# Patient Record
Sex: Male | Born: 1965 | Race: White | Hispanic: No | Marital: Single | Smoking: Former smoker
Health system: Southern US, Community
[De-identification: ages and names within clinical notes are randomized; demographics above are authoritative.]

## PROBLEM LIST (undated history)

## (undated) DIAGNOSIS — I10 Essential (primary) hypertension: Secondary | ICD-10-CM

---

## 2001-03-02 ENCOUNTER — Emergency Department (HOSPITAL_COMMUNITY): Admission: EM | Admit: 2001-03-02 | Discharge: 2001-03-02 | Payer: Self-pay | Admitting: Emergency Medicine

## 2001-03-02 ENCOUNTER — Encounter: Payer: Self-pay | Admitting: Emergency Medicine

## 2001-05-31 ENCOUNTER — Emergency Department (HOSPITAL_COMMUNITY): Admission: EM | Admit: 2001-05-31 | Discharge: 2001-05-31 | Payer: Self-pay | Admitting: Emergency Medicine

## 2001-05-31 ENCOUNTER — Encounter: Payer: Self-pay | Admitting: Emergency Medicine

## 2002-10-10 ENCOUNTER — Encounter: Payer: Self-pay | Admitting: Emergency Medicine

## 2002-10-10 ENCOUNTER — Emergency Department (HOSPITAL_COMMUNITY): Admission: EM | Admit: 2002-10-10 | Discharge: 2002-10-10 | Payer: Self-pay | Admitting: Emergency Medicine

## 2004-07-26 ENCOUNTER — Emergency Department (HOSPITAL_COMMUNITY): Admission: EM | Admit: 2004-07-26 | Discharge: 2004-07-27 | Payer: Self-pay | Admitting: Emergency Medicine

## 2004-07-27 ENCOUNTER — Ambulatory Visit (HOSPITAL_COMMUNITY): Admission: RE | Admit: 2004-07-27 | Discharge: 2004-07-27 | Payer: Self-pay | Admitting: Emergency Medicine

## 2008-12-26 ENCOUNTER — Ambulatory Visit: Payer: Self-pay | Admitting: Unknown Physician Specialty

## 2008-12-26 ENCOUNTER — Emergency Department: Payer: Self-pay | Admitting: Emergency Medicine

## 2009-09-25 IMAGING — CR DG LUMBAR SPINE 2-3V
1 series · 3 of 3 positions shown · non-contrast
Comparison: none

REASON FOR EXAM: right low back pain
COMMENTS:

PROCEDURE:     DXR - DXR LUMBAR SPINE AP AND LATERAL  - December 26, 2008 [DATE]
RESULT:     The spinal alignment, vertebral body heights and intervertebral
disc spaces appear to be maintained. There is no fracture or subluxation.
The bowel gas pattern is unremarkable.

[Series 1: view not recorded · 0.17mm/px · 3 of 3 slices shown]
[im 1/3]
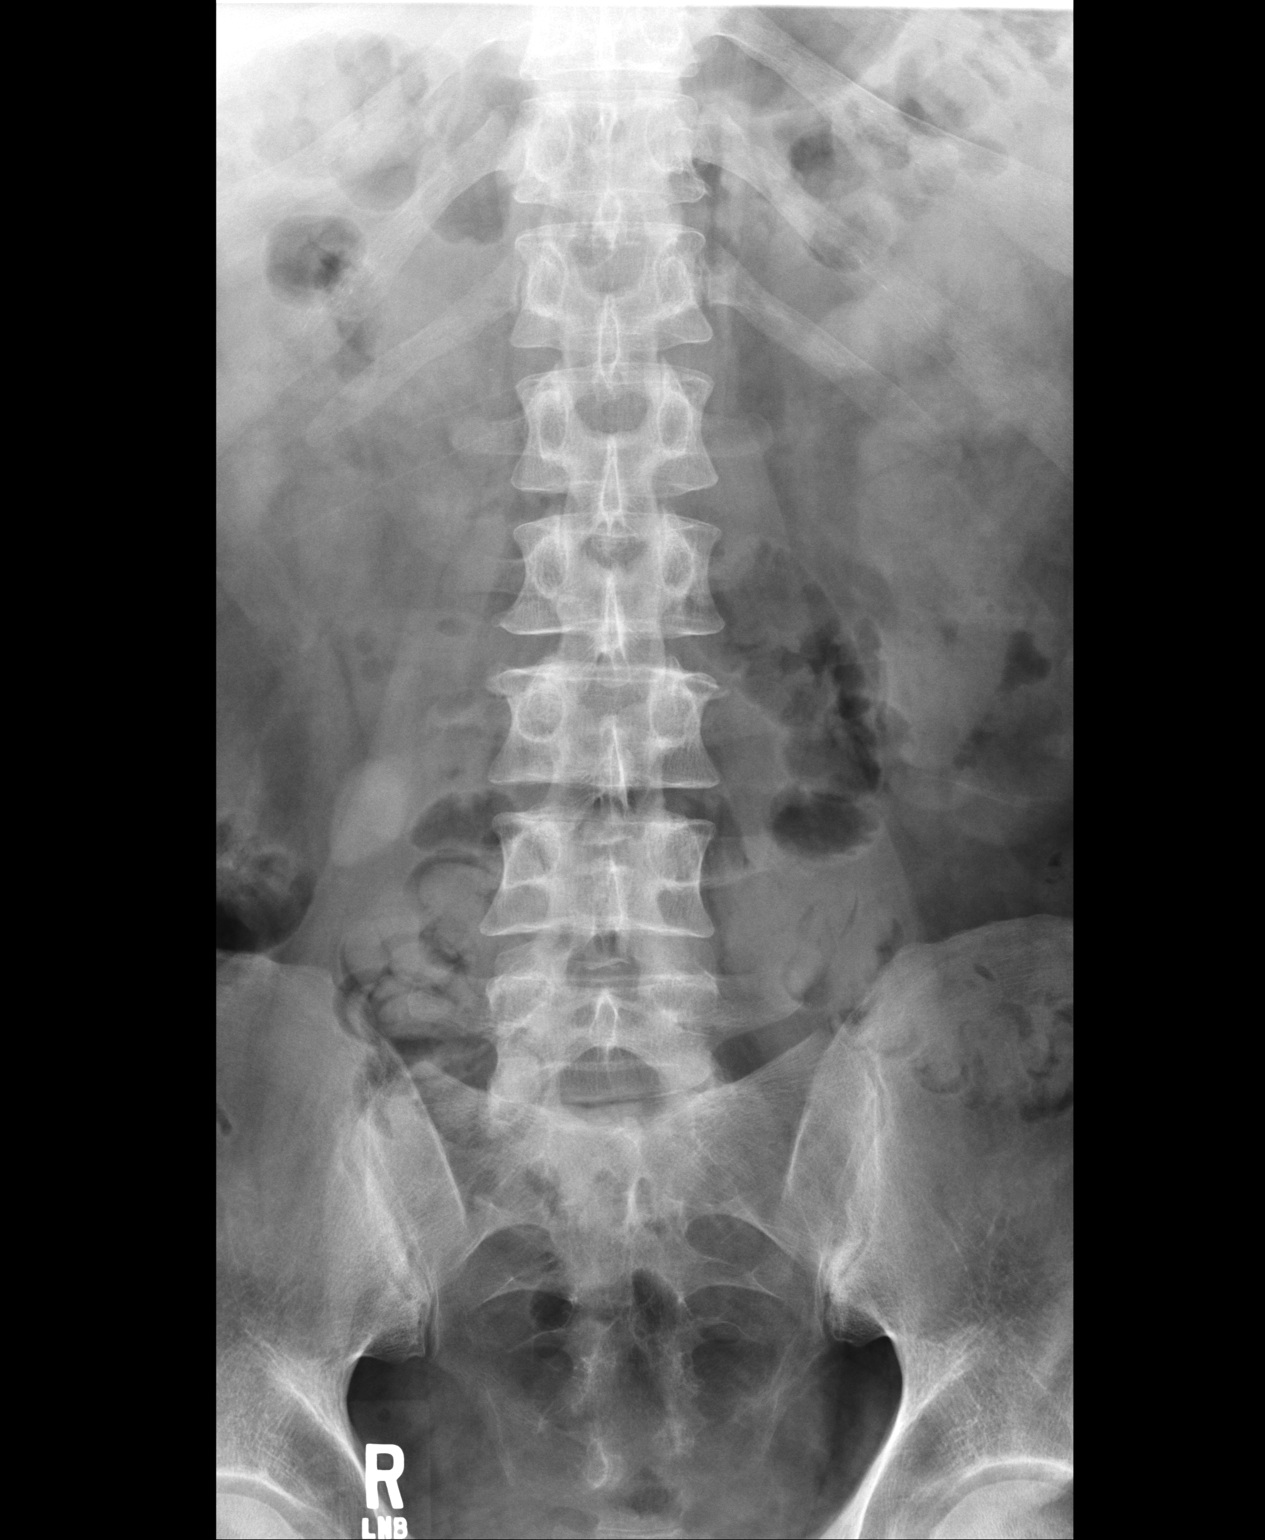
[im 2/3]
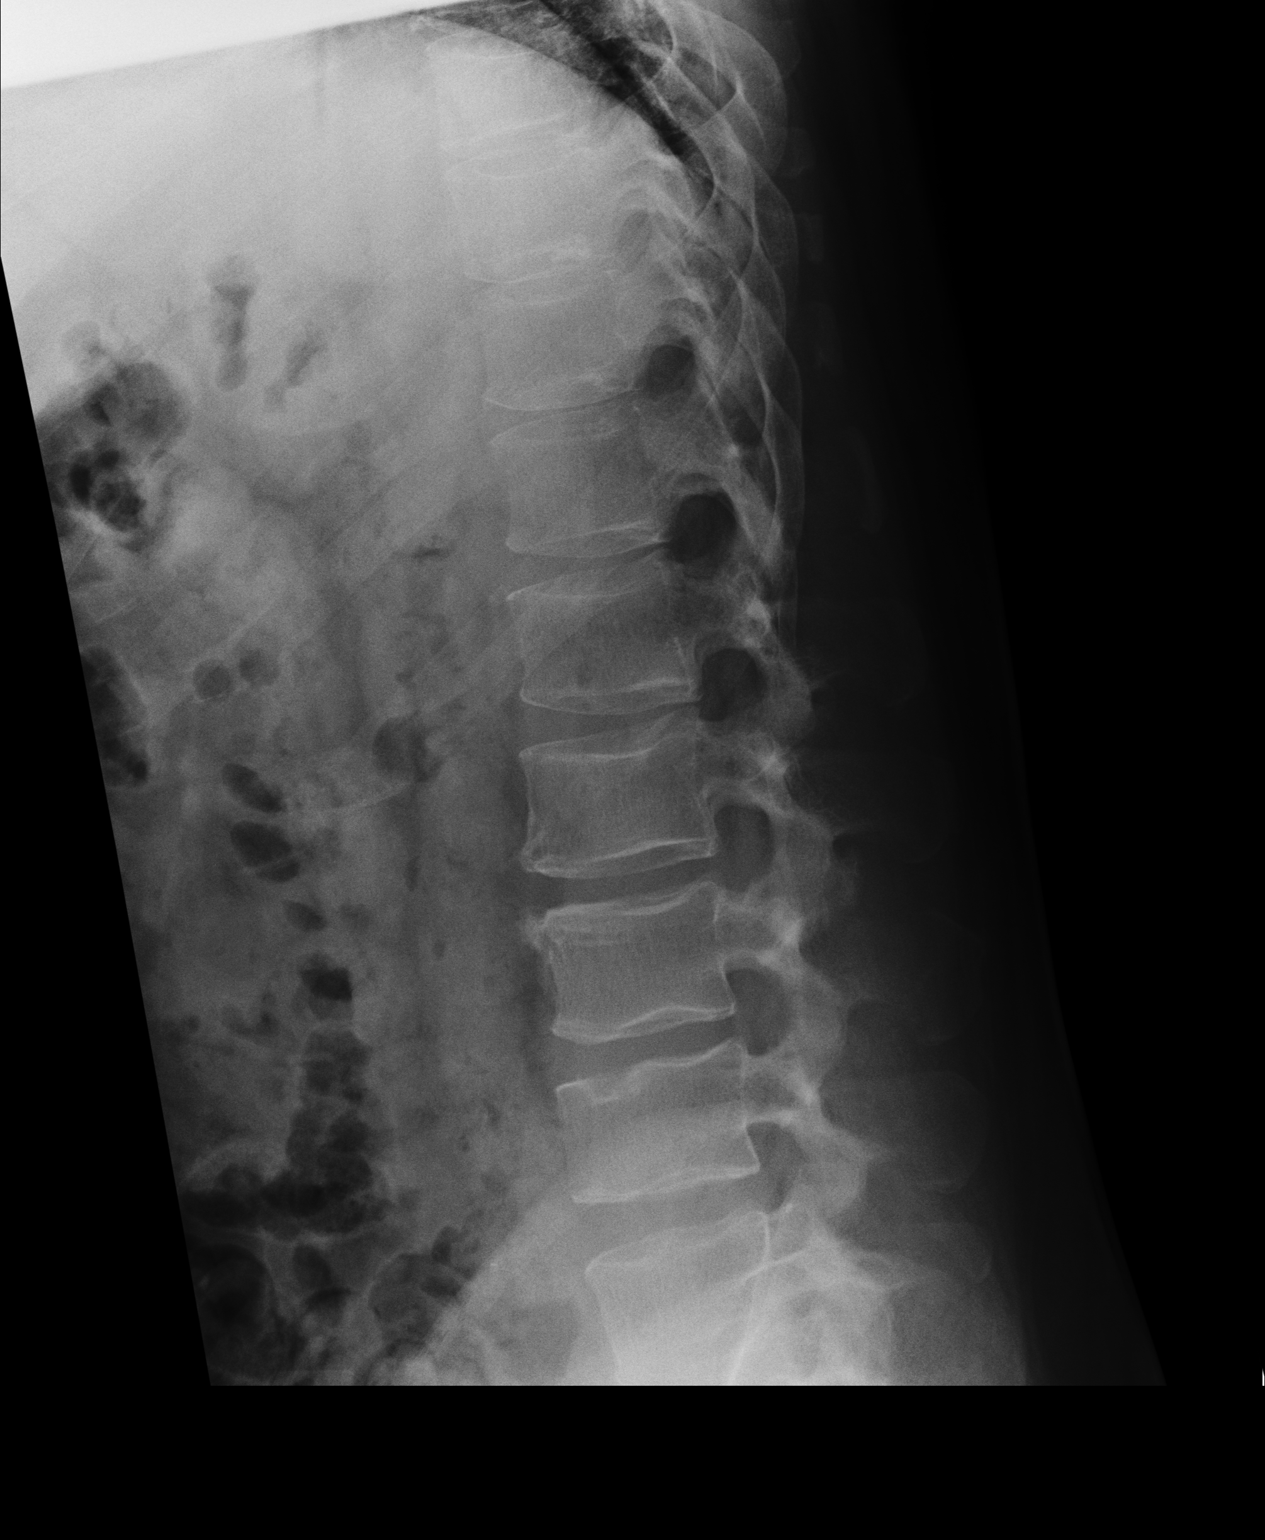
[im 3/3]
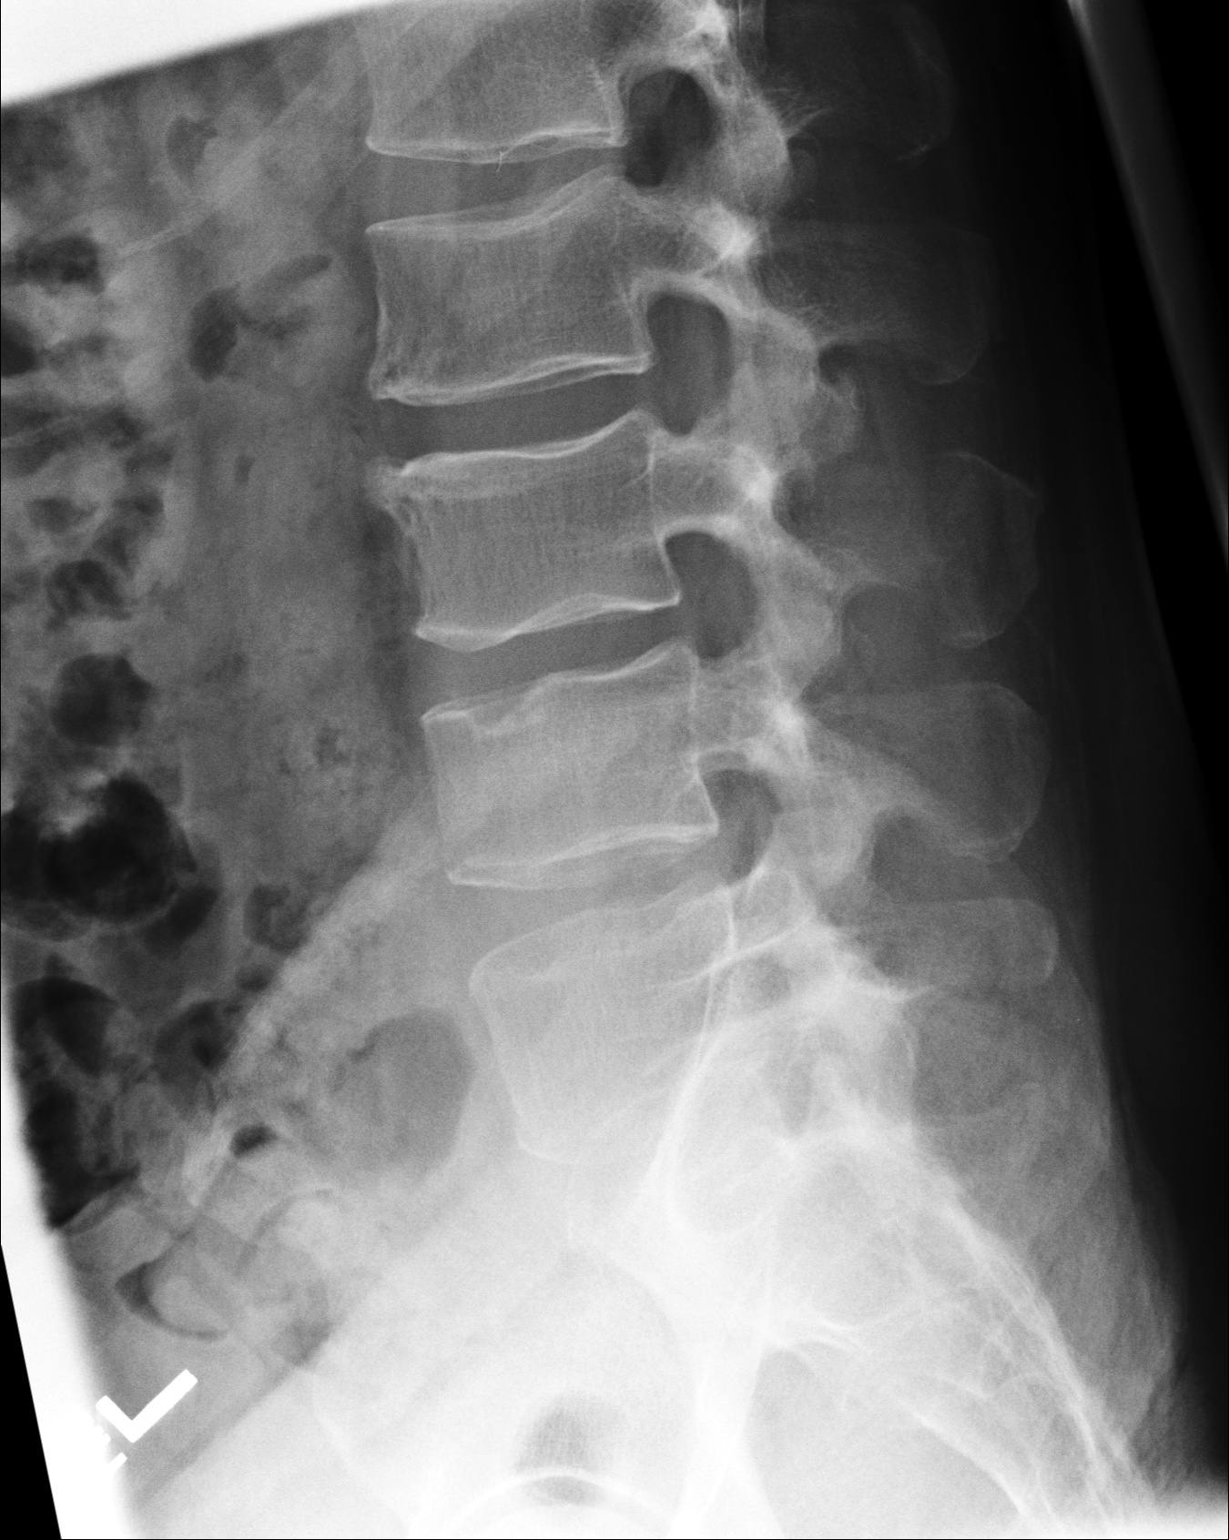

[3 of 3 positions shown; findings below may reference images not displayed]

IMPRESSION: No acute bony abnormality evident.

## 2009-09-25 IMAGING — CR RIGHT HIP - COMPLETE 2+ VIEW
1 series · 2 of 2 positions shown · non-contrast
Comparison: none

REASON FOR EXAM: right hip/back pain
COMMENTS:

PROCEDURE:     DXR - DXR HIP RIGHT COMPLETE  - December 26, 2008 [DATE]
RESULT:     Images of the right hip demonstrate no evidence of fracture,
dislocation or radiopaque foreign body.

[Series 1: view not recorded · 0.17mm/px · 2 of 2 slices shown]
[im 1/2]
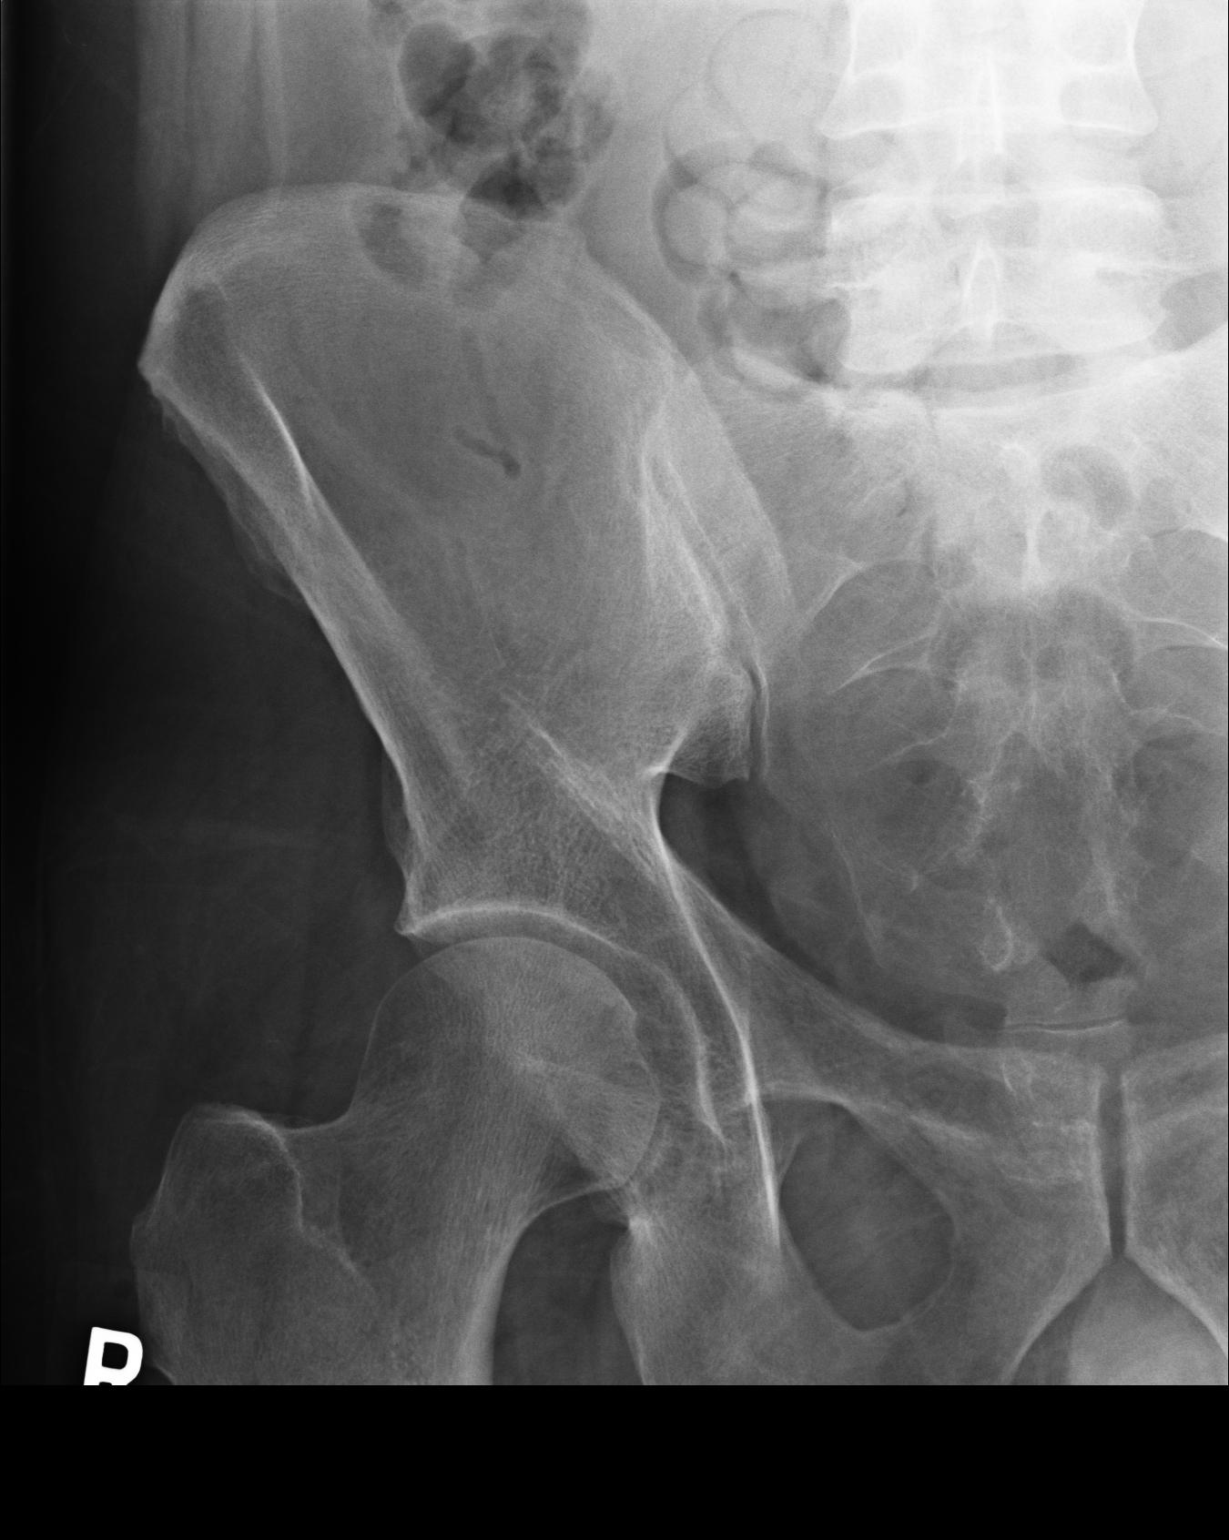
[im 2/2]
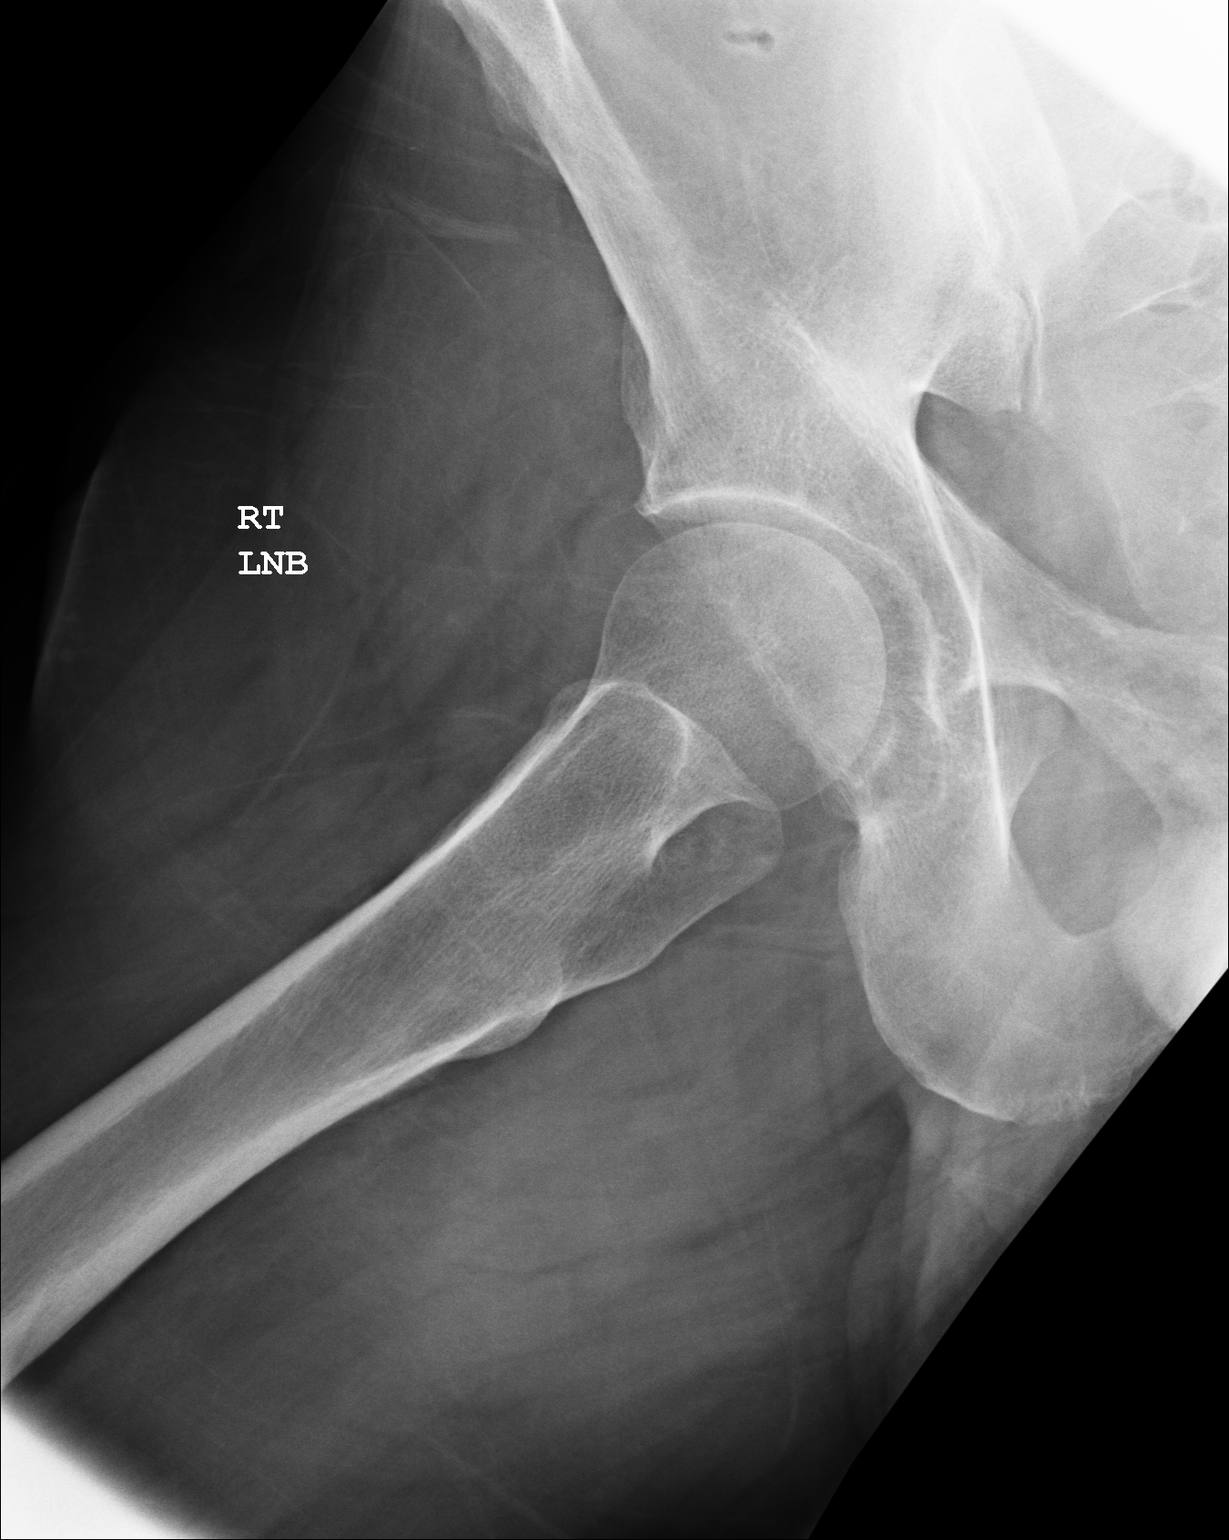

[2 of 2 positions shown; findings below may reference images not displayed]

IMPRESSION: Please see above.

## 2020-07-14 ENCOUNTER — Encounter: Payer: Self-pay | Admitting: Family Medicine

## 2020-07-14 ENCOUNTER — Other Ambulatory Visit: Payer: Self-pay

## 2020-07-14 ENCOUNTER — Ambulatory Visit: Payer: BC Managed Care – PPO | Admitting: Family Medicine

## 2020-07-14 VITALS — BP 164/108 | HR 71 | Ht 68.0 in | Wt 254.7 lb

## 2020-07-14 DIAGNOSIS — I10 Essential (primary) hypertension: Secondary | ICD-10-CM

## 2020-07-14 MED ORDER — LISINOPRIL-HYDROCHLOROTHIAZIDE 20-12.5 MG PO TABS: 2.0000 | ORAL_TABLET | Freq: Every day | ORAL | 3 refills | Status: DC

## 2020-07-14 NOTE — Progress Notes (Signed)
Established Patient Office Visit  SUBJECTIVE:  Subjective  Patient ID: Dillon Lewis, male    DOB: Mar 08, 1966  Age: 54 y.o. MRN: [id number]  CC:  Chief Complaint  Patient presents with  . Hypertension    patient last seen in 2017, he's here to reestablish care and needs refill of BP meds     HPI Dillon Lewis is a 54 y.o. male presenting today for HTN fu and to establish care again.   History reviewed. No pertinent past medical history.  History reviewed. No pertinent surgical history.  History reviewed. No pertinent family history.  Social History   Socioeconomic History  . Marital status: Single    Spouse name: Not on file  . Number of children: Not on file  . Years of education: Not on file  . Highest education level: Not on file  Occupational History  . Not on file  Tobacco Use  . Smoking status: Never Smoker  . Smokeless tobacco: Never Used  Substance and Sexual Activity  . Alcohol use: Not Currently  . Drug use: Not on file  . Sexual activity: Not on file  Other Topics Concern  . Not on file  Social History Narrative  . Not on file   Social Determinants of Health   Financial Resource Strain:   . Difficulty of Paying Living Expenses: Not on file  Food Insecurity:   . Worried About Charity fundraiser in the Last Year: Not on file  . Ran Out of Food in the Last Year: Not on file  Transportation Needs:   . Lack of Transportation (Medical): Not on file  . Lack of Transportation (Non-Medical): Not on file  Physical Activity:   . Days of Exercise per Week: Not on file  . Minutes of Exercise per Session: Not on file  Stress:   . Feeling of Stress : Not on file  Social Connections:   . Frequency of Communication with Friends and Family: Not on file  . Frequency of Social Gatherings with Friends and Family: Not on file  . Attends Religious Services: Not on file  . Active Member of Clubs or Organizations: Not on file  . Attends Archivist  Meetings: Not on file  . Marital Status: Not on file  Intimate Partner Violence:   . Fear of Current or Ex-Partner: Not on file  . Emotionally Abused: Not on file  . Physically Abused: Not on file  . Sexually Abused: Not on file     Current Outpatient Medications:  .  lisinopril (ZESTRIL) 20 MG tablet, Take 20 mg by mouth 2 (two) times daily., Disp: , Rfl:  .  lisinopril-hydrochlorothiazide (ZESTORETIC) 20-12.5 MG tablet, Take 2 tablets by mouth daily., Disp: 90 tablet, Rfl: 3   Not on File  ROS Review of Systems  Constitutional: Negative.   HENT: Negative.   Eyes: Negative.   Respiratory: Negative.   Cardiovascular: Negative.   Gastrointestinal: Negative.   Genitourinary: Negative.   Musculoskeletal: Negative.   Hematological: Negative.   Psychiatric/Behavioral: Negative.   All other systems reviewed and are negative.    OBJECTIVE:    Physical Exam HENT:     Right Ear: Tympanic membrane normal.     Left Ear: Tympanic membrane normal.     Nose: Nose normal.     Mouth/Throat:     Mouth: Mucous membranes are moist.  Eyes:     Pupils: Pupils are equal, round, and reactive to light.  Cardiovascular:  Rate and Rhythm: Normal rate and regular rhythm.  Abdominal:     General: Abdomen is flat.  Musculoskeletal:        General: Normal range of motion.     Cervical back: Normal range of motion.  Skin:    General: Skin is warm.  Neurological:     Mental Status: He is alert.  Psychiatric:        Mood and Affect: Mood normal.     BP (!) 164/108   Pulse 71   Ht 5' 8"  (1.727 m)   Wt 254 lb 11.2 oz (115.5 kg)   BMI 38.73 kg/m  Wt Readings from Last 3 Encounters:  07/14/20 254 lb 11.2 oz (115.5 kg)    Health Maintenance Due  Topic Date Due  . Hepatitis C Screening  Never done  . COVID-19 Vaccine (1) Never done  . HIV Screening  Never done  . TETANUS/TDAP  Never done  . COLONOSCOPY  Never done  . INFLUENZA VACCINE  Never done    There are no preventive  care reminders to display for this patient.  No flowsheet data found. No flowsheet data found.  No results found for: TSH No results found for: ALBUMIN, ANIONGAP, EGFR, GFR No results found for: CHOL, HDL, LDLCALC, CHOLHDL No results found for: TRIG No results found for: HGBA1C    ASSESSMENT & PLAN:   Problem List Items Addressed This Visit      Cardiovascular and Mediastinum   Primary hypertension - Primary    Pt had established care here 3 years ago but has not been back in several years. No problems with meds although his BP is not Optimized. Increase Lisinopril to 40 mg and add 125 mg HCTZ.       Relevant Medications   lisinopril (ZESTRIL) 20 MG tablet   lisinopril-hydrochlorothiazide (ZESTORETIC) 20-12.5 MG tablet      Meds ordered this encounter  Medications  . lisinopril-hydrochlorothiazide (ZESTORETIC) 20-12.5 MG tablet    Sig: Take 2 tablets by mouth daily.    Dispense:  90 tablet    Refill:  3      Follow-up: No follow-ups on file.    Beckie Salts, [facility] [address], [place], [place] [address]

## 2020-07-14 NOTE — Assessment & Plan Note (Signed)
Pt had established care here 3 years ago but has not been back in several years. No problems with meds although his BP is not Optimized. Increase Lisinopril to 40 mg and add 125 mg HCTZ.

## 2020-08-10 ENCOUNTER — Other Ambulatory Visit: Payer: Self-pay

## 2020-08-10 ENCOUNTER — Encounter: Payer: Self-pay | Admitting: Internal Medicine

## 2020-08-10 ENCOUNTER — Ambulatory Visit (INDEPENDENT_AMBULATORY_CARE_PROVIDER_SITE_OTHER): Payer: BC Managed Care – PPO | Admitting: Internal Medicine

## 2020-08-10 VITALS — BP 120/87 | HR 78 | Ht 71.0 in | Wt 253.7 lb

## 2020-08-10 DIAGNOSIS — E669 Obesity, unspecified: Secondary | ICD-10-CM | POA: Diagnosis not present

## 2020-08-10 DIAGNOSIS — I10 Essential (primary) hypertension: Secondary | ICD-10-CM | POA: Diagnosis not present

## 2020-08-10 NOTE — Progress Notes (Signed)
Established Patient Office Visit  Subjective:  Patient ID: Dillon Lewis, male    DOB: 08/23/1966  Age: 54 y.o. MRN: [id number]  CC:  Chief Complaint  Patient presents with   Hypertension    HPI  [name] presents for BP evaluation  No past medical history on file.  No past surgical history on file.  No family history on file.  Social History   Socioeconomic History   Marital status: Single    Spouse name: Not on file   Number of children: Not on file   Years of education: Not on file   Highest education level: Not on file  Occupational History   Not on file  Tobacco Use   Smoking status: Never Smoker   Smokeless tobacco: Never Used  Substance and Sexual Activity   Alcohol use: Not Currently   Drug use: Not on file   Sexual activity: Not on file  Other Topics Concern   Not on file  Social History Narrative   Not on file   Social Determinants of Health   Financial Resource Strain: Not on file  Food Insecurity: Not on file  Transportation Needs: Not on file  Physical Activity: Not on file  Stress: Not on file  Social Connections: Not on file  Intimate Partner Violence: Not on file     Current Outpatient Medications:    lisinopril (ZESTRIL) 20 MG tablet, Take 20 mg by mouth 2 (two) times daily., Disp: , Rfl:    lisinopril-hydrochlorothiazide (ZESTORETIC) 20-12.5 MG tablet, Take 2 tablets by mouth daily., Disp: 90 tablet, Rfl: 3   rosuvastatin (CRESTOR) 10 MG tablet, Take 1 tablet (10 mg total) by mouth daily., Disp: 90 tablet, Rfl: 3   Not on File  ROS Review of Systems  Constitutional: Negative.   HENT: Negative.   Eyes: Negative.   Respiratory: Negative.   Cardiovascular: Negative.   Gastrointestinal: Negative.   Endocrine: Negative.   Genitourinary: Negative.   Musculoskeletal: Negative.   Skin: Negative.   Allergic/Immunologic: Negative.   Neurological: Negative.   Hematological: Negative.   Psychiatric/Behavioral:  Negative.   All other systems reviewed and are negative.     Objective:    Physical Exam Vitals reviewed.  Constitutional:      Appearance: Normal appearance.  HENT:     Mouth/Throat:     Mouth: Mucous membranes are moist.  Eyes:     Pupils: Pupils are equal, round, and reactive to light.  Neck:     Vascular: No carotid bruit.  Cardiovascular:     Rate and Rhythm: Normal rate and regular rhythm.     Pulses: Normal pulses.     Heart sounds: Normal heart sounds.  Pulmonary:     Effort: Pulmonary effort is normal.     Breath sounds: Normal breath sounds.  Abdominal:     General: Bowel sounds are normal.     Palpations: Abdomen is soft. There is no hepatomegaly, splenomegaly or mass.     Tenderness: There is no abdominal tenderness.     Hernia: No hernia is present.  Musculoskeletal:     Cervical back: Neck supple.     Right lower leg: No edema.     Left lower leg: No edema.  Skin:    Findings: No rash.  Neurological:     Mental Status: He is alert and oriented to person, place, and time.     Motor: No weakness.  Psychiatric:        Mood  and Affect: Mood normal.        Behavior: Behavior normal.     BP 120/87    Pulse 78    Ht 5\' 11"  (1.803 m)    Wt 253 lb 11.2 oz (115.1 kg)    BMI 35.38 kg/m  Wt Readings from Last 3 Encounters:  08/18/20 258 lb 9.6 oz (117.3 kg)  08/10/20 253 lb 11.2 oz (115.1 kg)  07/14/20 254 lb 11.2 oz (115.5 kg)     Health Maintenance Due  Topic Date Due   Hepatitis C Screening  Never done   COVID-19 Vaccine (1) Never done   HIV Screening  Never done   TETANUS/TDAP  Never done   COLONOSCOPY  Never done   INFLUENZA VACCINE  Never done    There are no preventive care reminders to display for this patient.  Lab Results  Component Value Date   TSH 1.66 08/10/2020   Lab Results  Component Value Date   WBC 10.2 08/10/2020   HGB 16.9 08/10/2020   HCT 48.3 08/10/2020   MCV 101.0 (H) 08/10/2020   PLT 296 08/10/2020   Lab  Results  Component Value Date   NA 151 (H) 08/10/2020   K 5.5 (H) 08/10/2020   CO2 15 (L) 08/10/2020   GLUCOSE 105 (H) 08/10/2020   BUN 12 08/10/2020   CREATININE 0.88 08/10/2020   BILITOT 0.5 08/10/2020   AST 43 (H) 08/10/2020   ALT 42 08/10/2020   PROT 7.9 08/10/2020   CALCIUM 9.5 08/10/2020   Lab Results  Component Value Date   CHOL 244 (H) 08/10/2020   Lab Results  Component Value Date   HDL 66 08/10/2020   Lab Results  Component Value Date   LDLCALC 151 (H) 08/10/2020   Lab Results  Component Value Date   TRIG 142 08/10/2020   Lab Results  Component Value Date   CHOLHDL 3.7 08/10/2020   No results found for: HGBA1C    Assessment & Plan:   Problem List Items Addressed This Visit      Cardiovascular and Mediastinum   Essential hypertension - Primary    - Today, the patient's blood pressure is well managed on present treatment. - The patient will continue the current treatment regimen.  - I encouraged the patient to eat a low-sodium diet to help control blood pressure. - I encouraged the patient to live an active lifestyle and complete activities that increases heart rate to 85% target heart rate at least 5 times per week for one hour.          Relevant Orders   CBC with Differential/Platelet (Completed)     Other   Obesity (BMI 30-39.9)    - I encouraged the patient to lose weight.  - I educated them on making healthy dietary choices including eating more fruits and vegetables and less fried foods. - I encouraged the patient to exercise more, and educated on the benefits of exercise including weight loss, diabetes prevention, and hypertension prevention.        Relevant Orders   COMPLETE METABOLIC PANEL WITH GFR (Completed)   Lipid panel (Completed)   TSH (Completed)      No orders of the defined types were placed in this encounter.   Follow-up: No follow-ups on file.  Patient was advised to have a colonoscopy.  #2 suggest Covid shot and  flu shot but he declined #3 he was advised to lose weight.  #4 suggest patient to see an eye doctor.  Dillon Athens, MD

## 2020-08-11 LAB — LIPID PANEL
Cholesterol: 244 mg/dL — ABNORMAL HIGH (ref <200)
HDL: 66 mg/dL (ref >=40)
LDL Cholesterol (Calc): 151 mg/dL (calc) — ABNORMAL HIGH
Non-HDL Cholesterol (Calc): 178 mg/dL (calc) — ABNORMAL HIGH (ref <130)
Total CHOL/HDL Ratio: 3.7 (calc) (ref <5.0)
Triglycerides: 142 mg/dL (ref <150)

## 2020-08-11 LAB — CBC WITH DIFFERENTIAL/PLATELET
Absolute Monocytes: 714 cells/uL (ref 200–950)
Basophils Absolute: 61 cells/uL (ref 0–200)
Basophils Relative: 0.6 %
Eosinophils Absolute: 153 cells/uL (ref 15–500)
Eosinophils Relative: 1.5 %
HCT: 48.3 % (ref 38.5–50.0)
Hemoglobin: 16.9 g/dL (ref 13.2–17.1)
Lymphs Abs: 2713 cells/uL (ref 850–3900)
MCH: 35.4 pg — ABNORMAL HIGH (ref 27.0–33.0)
MCHC: 35 g/dL (ref 32.0–36.0)
MCV: 101 fL — ABNORMAL HIGH (ref 80.0–100.0)
MPV: 9.8 fL (ref 7.5–12.5)
Monocytes Relative: 7 %
Neutro Abs: 6559 cells/uL (ref 1500–7800)
Neutrophils Relative %: 64.3 %
Platelets: 296 10*3/uL (ref 140–400)
RBC: 4.78 10*6/uL (ref 4.20–5.80)
RDW: 11.8 % (ref 11.0–15.0)
Total Lymphocyte: 26.6 %
WBC: 10.2 10*3/uL (ref 3.8–10.8)

## 2020-08-11 LAB — COMPLETE METABOLIC PANEL WITH GFR
AG Ratio: 1.5 (calc) (ref 1.0–2.5)
ALT: 42 U/L (ref 9–46)
AST: 43 U/L — ABNORMAL HIGH (ref 10–35)
Albumin: 4.7 g/dL (ref 3.6–5.1)
Alkaline phosphatase (APISO): 90 U/L (ref 35–144)
BUN: 12 mg/dL (ref 7–25)
CO2: 15 mmol/L — ABNORMAL LOW (ref 20–32)
Calcium: 9.5 mg/dL (ref 8.6–10.3)
Chloride: 108 mmol/L (ref 98–110)
Creat: 0.88 mg/dL (ref 0.70–1.33)
GFR, Est African American: 114 mL/min/{1.73_m2} (ref >=60)
GFR, Est Non African American: 99 mL/min/{1.73_m2} (ref >=60)
Globulin: 3.2 g/dL (calc) (ref 1.9–3.7)
Glucose, Bld: 105 mg/dL — ABNORMAL HIGH (ref 65–99)
Potassium: 5.5 mmol/L — ABNORMAL HIGH (ref 3.5–5.3)
Sodium: 151 mmol/L — ABNORMAL HIGH (ref 135–146)
Total Bilirubin: 0.5 mg/dL (ref 0.2–1.2)
Total Protein: 7.9 g/dL (ref 6.1–8.1)

## 2020-08-11 LAB — TSH: TSH: 1.66 mIU/L (ref 0.40–4.50)

## 2020-08-18 ENCOUNTER — Encounter: Payer: Self-pay | Admitting: Family Medicine

## 2020-08-18 ENCOUNTER — Ambulatory Visit (INDEPENDENT_AMBULATORY_CARE_PROVIDER_SITE_OTHER): Payer: BC Managed Care – PPO | Admitting: Family Medicine

## 2020-08-18 VITALS — BP 124/86 | HR 92 | Ht 71.0 in | Wt 258.6 lb

## 2020-08-18 DIAGNOSIS — I10 Essential (primary) hypertension: Secondary | ICD-10-CM | POA: Diagnosis not present

## 2020-08-18 DIAGNOSIS — E785 Hyperlipidemia, unspecified: Secondary | ICD-10-CM

## 2020-08-18 MED ORDER — ROSUVASTATIN CALCIUM 10 MG PO TABS: 10.0000 mg | ORAL_TABLET | Freq: Every day | ORAL | 3 refills | Status: DC

## 2020-08-19 ENCOUNTER — Encounter: Payer: Self-pay | Admitting: Internal Medicine

## 2020-08-19 NOTE — Assessment & Plan Note (Signed)
-   I encouraged the patient to lose weight.  - I educated them on making healthy dietary choices including eating more fruits and vegetables and less fried foods. - I encouraged the patient to exercise more, and educated on the benefits of exercise including weight loss, diabetes prevention, and hypertension prevention.   

## 2020-08-19 NOTE — Assessment & Plan Note (Signed)
-   Today, the patient's blood pressure is well managed on present  treatment. - The patient will continue the current treatment regimen.  - I encouraged the patient to eat a low-sodium diet to help control blood pressure. - I encouraged the patient to live an active lifestyle and complete activities that increases heart rate to 85% target heart rate at least 5 times per week for one hour.     

## 2020-08-25 DIAGNOSIS — E785 Hyperlipidemia, unspecified: Secondary | ICD-10-CM | POA: Insufficient documentation

## 2020-08-25 NOTE — Progress Notes (Signed)
Established Patient Office Visit  SUBJECTIVE:  Subjective  Patient ID: Dillon Lewis, male    DOB: 1965/11/09  Age: 54 y.o. MRN: [id number]  CC:  Chief Complaint  Patient presents with  . lab results    HPI Dillon Lewis is a 54 y.o. male presenting today for     History reviewed. No pertinent past medical history.  History reviewed. No pertinent surgical history.  History reviewed. No pertinent family history.  Social History   Socioeconomic History  . Marital status: Single    Spouse name: Not on file  . Number of children: Not on file  . Years of education: Not on file  . Highest education level: Not on file  Occupational History  . Not on file  Tobacco Use  . Smoking status: Never Smoker  . Smokeless tobacco: Never Used  Substance and Sexual Activity  . Alcohol use: Not Currently  . Drug use: Not on file  . Sexual activity: Not on file  Other Topics Concern  . Not on file  Social History Narrative  . Not on file   Social Determinants of Health   Financial Resource Strain: Not on file  Food Insecurity: Not on file  Transportation Needs: Not on file  Physical Activity: Not on file  Stress: Not on file  Social Connections: Not on file  Intimate Partner Violence: Not on file     Current Outpatient Medications:  .  lisinopril (ZESTRIL) 20 MG tablet, Take 20 mg by mouth 2 (two) times daily., Disp: , Rfl:  .  lisinopril-hydrochlorothiazide (ZESTORETIC) 20-12.5 MG tablet, Take 2 tablets by mouth daily., Disp: 90 tablet, Rfl: 3 .  rosuvastatin (CRESTOR) 10 MG tablet, Take 1 tablet (10 mg total) by mouth daily., Disp: 90 tablet, Rfl: 3   Not on File  ROS Review of Systems  Constitutional: Negative.   HENT: Negative.   Respiratory: Negative.   Neurological: Negative.   Hematological: Negative.      OBJECTIVE:    Physical Exam Constitutional:      Appearance: He is obese.  HENT:     Head: Normocephalic and atraumatic.     Nose: Nose normal.      Mouth/Throat:     Mouth: Mucous membranes are dry.  Cardiovascular:     Rate and Rhythm: Normal rate and regular rhythm.  Pulmonary:     Effort: Pulmonary effort is normal.     Breath sounds: Normal breath sounds.  Skin:    Capillary Refill: Capillary refill takes less than 2 seconds.     BP 124/86   Pulse 92   Ht 5' 11"  (1.803 m)   Wt 258 lb 9.6 oz (117.3 kg)   BMI 36.07 kg/m  Wt Readings from Last 3 Encounters:  08/18/20 258 lb 9.6 oz (117.3 kg)  08/10/20 253 lb 11.2 oz (115.1 kg)  07/14/20 254 lb 11.2 oz (115.5 kg)    Health Maintenance Due  Topic Date Due  . Hepatitis C Screening  Never done  . COVID-19 Vaccine (1) Never done  . HIV Screening  Never done  . TETANUS/TDAP  Never done  . COLONOSCOPY  Never done  . INFLUENZA VACCINE  Never done    There are no preventive care reminders to display for this patient.  CBC Latest Ref Rng & Units 08/10/2020  WBC 3.8 - 10.8 Thousand/uL 10.2  Hemoglobin 13.2 - 17.1 g/dL 16.9  Hematocrit 38.5 - 50.0 % 48.3  Platelets 140 - 400 Thousand/uL 296  CMP Latest Ref Rng & Units 08/10/2020  Glucose 65 - 99 mg/dL 105(H)  BUN 7 - 25 mg/dL 12  Creatinine 0.70 - 1.33 mg/dL 0.88  Sodium 135 - 146 mmol/L 151(H)  Potassium 3.5 - 5.3 mmol/L 5.5(H)  Chloride 98 - 110 mmol/L 108  CO2 20 - 32 mmol/L 15(L)  Calcium 8.6 - 10.3 mg/dL 9.5  Total Protein 6.1 - 8.1 g/dL 7.9  Total Bilirubin 0.2 - 1.2 mg/dL 0.5  AST 10 - 35 U/L 43(H)  ALT 9 - 46 U/L 42    Lab Results  Component Value Date   TSH 1.66 08/10/2020   No results found for: ALBUMIN, ANIONGAP, EGFR, GFR Lab Results  Component Value Date   CHOL 244 (H) 08/10/2020   HDL 66 08/10/2020   LDLCALC 151 (H) 08/10/2020   CHOLHDL 3.7 08/10/2020   Lab Results  Component Value Date   TRIG 142 08/10/2020   No results found for: HGBA1C    ASSESSMENT & PLAN:   Problem List Items Addressed This Visit      Cardiovascular and Mediastinum   Essential hypertension    Patient's  blood pressure is within the desired range. Medication side effects include: no side effects noted Continue current treatment regimen.  BP controlled today       Relevant Medications   rosuvastatin (CRESTOR) 10 MG tablet     Other   Hyperlipidemia - Primary    Discussed Lipid panel and risk factors for CAD event, Crestor 10 mg ordered today.       Relevant Medications   rosuvastatin (CRESTOR) 10 MG tablet      Meds ordered this encounter  Medications  . rosuvastatin (CRESTOR) 10 MG tablet    Sig: Take 1 tablet (10 mg total) by mouth daily.    Dispense:  90 tablet    Refill:  3      Follow-up: No follow-ups on file.    Beckie Salts, [facility] [address] [place], [place] [address]

## 2020-08-25 NOTE — Assessment & Plan Note (Signed)
Patient's blood pressure is within the desired range. Medication side effects include: no side effects noted Continue current treatment regimen.  BP controlled today

## 2020-08-25 NOTE — Assessment & Plan Note (Signed)
Discussed Lipid panel and risk factors for CAD event, Crestor 10 mg ordered today.

## 2021-01-06 ENCOUNTER — Other Ambulatory Visit: Payer: Self-pay | Admitting: Family Medicine

## 2021-02-16 ENCOUNTER — Other Ambulatory Visit: Payer: Self-pay

## 2021-02-16 ENCOUNTER — Encounter: Payer: Self-pay | Admitting: Family Medicine

## 2021-02-16 ENCOUNTER — Ambulatory Visit (INDEPENDENT_AMBULATORY_CARE_PROVIDER_SITE_OTHER): Payer: BC Managed Care – PPO | Admitting: Family Medicine

## 2021-02-16 VITALS — BP 121/75 | HR 85 | Ht 71.0 in | Wt 243.6 lb

## 2021-02-16 DIAGNOSIS — E669 Obesity, unspecified: Secondary | ICD-10-CM

## 2021-02-16 DIAGNOSIS — E785 Hyperlipidemia, unspecified: Secondary | ICD-10-CM | POA: Diagnosis not present

## 2021-02-16 DIAGNOSIS — I10 Essential (primary) hypertension: Secondary | ICD-10-CM

## 2021-02-16 MED ORDER — LOVASTATIN 20 MG PO TABS: 20.0000 mg | ORAL_TABLET | Freq: Every day | ORAL | 3 refills | Status: DC

## 2021-02-16 NOTE — Progress Notes (Signed)
Established Patient Office Visit  SUBJECTIVE:  Subjective  Patient ID: Dillon Lewis, male    DOB: 08/23/1966  Age: 55 y.o. MRN: [id number]  CC:  Chief Complaint  Patient presents with   Hypertension    HPI DEVRIN Lewis is a 55 y.o. male presenting today for htn re eval and obesity.     History reviewed. No pertinent past medical history.  History reviewed. No pertinent surgical history.  History reviewed. No pertinent family history.  Social History   Socioeconomic History   Marital status: Single    Spouse name: Not on file   Number of children: Not on file   Years of education: Not on file   Highest education level: Not on file  Occupational History   Not on file  Tobacco Use   Smoking status: Never   Smokeless tobacco: Never  Substance and Sexual Activity   Alcohol use: Not Currently   Drug use: Not on file   Sexual activity: Not on file  Other Topics Concern   Not on file  Social History Narrative   Not on file   Social Determinants of Health   Financial Resource Strain: Not on file  Food Insecurity: Not on file  Transportation Needs: Not on file  Physical Activity: Not on file  Stress: Not on file  Social Connections: Not on file  Intimate Partner Violence: Not on file     Current Outpatient Medications:    lovastatin (MEVACOR) 20 MG tablet, Take 1 tablet (20 mg total) by mouth at bedtime., Disp: 90 tablet, Rfl: 3   lisinopril (ZESTRIL) 20 MG tablet, Take 20 mg by mouth 2 (two) times daily., Disp: , Rfl:    lisinopril-hydrochlorothiazide (ZESTORETIC) 20-12.5 MG tablet, TAKE 2 TABLETS BY MOUTH EVERY DAY, Disp: 180 tablet, Rfl: 1   Not on File  ROS Review of Systems  Constitutional: Negative.   HENT: Negative.    Respiratory: Negative.    Cardiovascular: Negative.   Genitourinary: Negative.   Psychiatric/Behavioral: Negative.      OBJECTIVE:    Physical Exam HENT:     Right Ear: Tympanic membrane normal.  Cardiovascular:     Rate  and Rhythm: Normal rate and regular rhythm.  Pulmonary:     Effort: Pulmonary effort is normal.  Skin:    General: Skin is warm.  Neurological:     Mental Status: He is alert.  Psychiatric:        Mood and Affect: Mood normal.    BP 121/75   Pulse 85   Ht 5' 11"  (1.803 m)   Wt 243 lb 9.6 oz (110.5 kg)   BMI 33.98 kg/m  Wt Readings from Last 3 Encounters:  02/16/21 243 lb 9.6 oz (110.5 kg)  08/18/20 258 lb 9.6 oz (117.3 kg)  08/10/20 253 lb 11.2 oz (115.1 kg)    Health Maintenance Due  Topic Date Due   COVID-19 Vaccine (1) Never done   HIV Screening  Never done   Hepatitis C Screening  Never done   TETANUS/TDAP  Never done   COLONOSCOPY (Pts 45-70yr Insurance coverage will need to be confirmed)  Never done   Zoster Vaccines- Shingrix (1 of 2) Never done    There are no preventive care reminders to display for this patient.  CBC Latest Ref Rng & Units 08/10/2020  WBC 3.8 - 10.8 Thousand/uL 10.2  Hemoglobin 13.2 - 17.1 g/dL 16.9  Hematocrit 38.5 - 50.0 % 48.3  Platelets 140 - 400 Thousand/uL  296   CMP Latest Ref Rng & Units 08/10/2020  Glucose 65 - 99 mg/dL 105(H)  BUN 7 - 25 mg/dL 12  Creatinine 0.70 - 1.33 mg/dL 0.88  Sodium 135 - 146 mmol/L 151(H)  Potassium 3.5 - 5.3 mmol/L 5.5(H)  Chloride 98 - 110 mmol/L 108  CO2 20 - 32 mmol/L 15(L)  Calcium 8.6 - 10.3 mg/dL 9.5  Total Protein 6.1 - 8.1 g/dL 7.9  Total Bilirubin 0.2 - 1.2 mg/dL 0.5  AST 10 - 35 U/L 43(H)  ALT 9 - 46 U/L 42    Lab Results  Component Value Date   TSH 1.66 08/10/2020   No results found for: ALBUMIN, ANIONGAP, EGFR, GFR Lab Results  Component Value Date   CHOL 244 (H) 08/10/2020   HDL 66 08/10/2020   LDLCALC 151 (H) 08/10/2020   CHOLHDL 3.7 08/10/2020   Lab Results  Component Value Date   TRIG 142 08/10/2020   No results found for: HGBA1C    ASSESSMENT & PLAN:   Problem List Items Addressed This Visit       Cardiovascular and Mediastinum   Essential hypertension -  Primary    Patient taking all meds as rx, no CP h/a or SOB.   Plan- Continue meds, decrease fast food and increase high quality food.        Relevant Medications   lovastatin (MEVACOR) 20 MG tablet     Other   Obesity (BMI 30-39.9)    Patient with poor diet, eats fast food most of the time or microwaved meals.  Plan- Discussed better quality meals with 2-5 vegetables per day.        Hyperlipidemia    Has not been taking Crestor because of the Cost, changing to lovastatin and will f/u 3 months. With labs       Relevant Medications   lovastatin (MEVACOR) 20 MG tablet    Meds ordered this encounter  Medications   lovastatin (MEVACOR) 20 MG tablet    Sig: Take 1 tablet (20 mg total) by mouth at bedtime.    Dispense:  90 tablet    Refill:  3      Follow-up: No follow-ups on file.    Beckie Salts, [facility] [address], [place], [place] [address]

## 2021-02-16 NOTE — Assessment & Plan Note (Signed)
Patient taking all meds as rx, no CP h/a or SOB.   Plan- Continue meds, decrease fast food and increase high quality food.

## 2021-02-16 NOTE — Assessment & Plan Note (Signed)
Patient with poor diet, eats fast food most of the time or microwaved meals.  Plan- Discussed better quality meals with 2-5 vegetables per day.

## 2021-02-16 NOTE — Assessment & Plan Note (Signed)
Has not been taking Crestor because of the Cost, changing to lovastatin and will f/u 3 months. With labs

## 2021-02-26 ENCOUNTER — Ambulatory Visit: Payer: BC Managed Care – PPO | Admitting: Internal Medicine

## 2021-03-23 ENCOUNTER — Emergency Department
Admission: EM | Admit: 2021-03-23 | Discharge: 2021-03-23 | Disposition: A | Discharge status: Home / Self Care | Payer: BC Managed Care – PPO | Attending: Emergency Medicine | Admitting: Emergency Medicine

## 2021-03-23 ENCOUNTER — Encounter: Payer: Self-pay | Admitting: Emergency Medicine

## 2021-03-23 ENCOUNTER — Other Ambulatory Visit: Payer: Self-pay

## 2021-03-23 DIAGNOSIS — I10 Essential (primary) hypertension: Secondary | ICD-10-CM | POA: Diagnosis not present

## 2021-03-23 DIAGNOSIS — R000 Tachycardia, unspecified: Secondary | ICD-10-CM | POA: Diagnosis not present

## 2021-03-23 DIAGNOSIS — K029 Dental caries, unspecified: Secondary | ICD-10-CM | POA: Diagnosis not present

## 2021-03-23 DIAGNOSIS — N179 Acute kidney failure, unspecified: Secondary | ICD-10-CM | POA: Diagnosis not present

## 2021-03-23 DIAGNOSIS — Z79899 Other long term (current) drug therapy: Secondary | ICD-10-CM | POA: Insufficient documentation

## 2021-03-23 DIAGNOSIS — E860 Dehydration: Secondary | ICD-10-CM | POA: Diagnosis not present

## 2021-03-23 DIAGNOSIS — K0889 Other specified disorders of teeth and supporting structures: Secondary | ICD-10-CM | POA: Diagnosis not present

## 2021-03-23 DIAGNOSIS — I959 Hypotension, unspecified: Secondary | ICD-10-CM | POA: Insufficient documentation

## 2021-03-23 LAB — COMPREHENSIVE METABOLIC PANEL
ALT: 40 U/L (ref 0–44)
AST: 59 U/L — ABNORMAL HIGH (ref 15–41)
Albumin: 3.9 g/dL (ref 3.5–5.0)
Alkaline Phosphatase: 89 U/L (ref 38–126)
Anion gap: 13 (ref 5–15)
BUN: 30 mg/dL — ABNORMAL HIGH (ref 6–20)
CO2: 21 mmol/L — ABNORMAL LOW (ref 22–32)
Calcium: 9 mg/dL (ref 8.9–10.3)
Chloride: 96 mmol/L — ABNORMAL LOW (ref 98–111)
Creatinine, Ser: 2.91 mg/dL — ABNORMAL HIGH (ref 0.61–1.24)
GFR, Estimated: 25 mL/min — ABNORMAL LOW (ref >60)
Glucose, Bld: 123 mg/dL — ABNORMAL HIGH (ref 70–99)
Potassium: 3.8 mmol/L (ref 3.5–5.1)
Sodium: 130 mmol/L — ABNORMAL LOW (ref 135–145)
Total Bilirubin: 0.8 mg/dL (ref 0.3–1.2)
Total Protein: 7.9 g/dL (ref 6.5–8.1)

## 2021-03-23 LAB — CBC WITH DIFFERENTIAL/PLATELET
Abs Immature Granulocytes: 0.09 10*3/uL — ABNORMAL HIGH (ref 0.00–0.07)
Basophils Absolute: 0 10*3/uL (ref 0.0–0.1)
Basophils Relative: 0 %
Eosinophils Absolute: 0.1 10*3/uL (ref 0.0–0.5)
Eosinophils Relative: 0 %
HCT: 45.9 % (ref 39.0–52.0)
Hemoglobin: 16.4 g/dL (ref 13.0–17.0)
Immature Granulocytes: 1 %
Lymphocytes Relative: 6 %
Lymphs Abs: 0.8 10*3/uL (ref 0.7–4.0)
MCH: 36.6 pg — ABNORMAL HIGH (ref 26.0–34.0)
MCHC: 35.7 g/dL (ref 30.0–36.0)
MCV: 102.5 fL — ABNORMAL HIGH (ref 80.0–100.0)
Monocytes Absolute: 0.6 10*3/uL (ref 0.1–1.0)
Monocytes Relative: 5 %
Neutro Abs: 10.3 10*3/uL — ABNORMAL HIGH (ref 1.7–7.7)
Neutrophils Relative %: 88 %
Platelets: 234 10*3/uL (ref 150–400)
RBC: 4.48 MIL/uL (ref 4.22–5.81)
RDW: 12.3 % (ref 11.5–15.5)
WBC: 11.8 10*3/uL — ABNORMAL HIGH (ref 4.0–10.5)
nRBC: 0 % (ref 0.0–0.2)

## 2021-03-23 LAB — ACETAMINOPHEN LEVEL: Acetaminophen (Tylenol), Serum: <10 ug/mL — ABNORMAL LOW (ref 10–30)

## 2021-03-23 LAB — SALICYLATE LEVEL: Salicylate Lvl: <7 mg/dL — ABNORMAL LOW (ref 7.0–30.0)

## 2021-03-23 MED ORDER — KETOROLAC TROMETHAMINE 30 MG/ML IJ SOLN
15.0000 mg | INTRAMUSCULAR | Status: AC
  Administered 2021-03-23: 15 mg via INTRAVENOUS
  Filled 2021-03-23: qty 1

## 2021-03-23 MED ORDER — PANTOPRAZOLE SODIUM 40 MG IV SOLR
40.0000 mg | Freq: Once | INTRAVENOUS | Status: AC
  Administered 2021-03-23: 40 mg via INTRAVENOUS
  Filled 2021-03-23: qty 40

## 2021-03-23 MED ORDER — KETOROLAC TROMETHAMINE 10 MG PO TABS: 10.0000 mg | ORAL_TABLET | Freq: Four times a day (QID) | ORAL | 0 refills | Status: DC | PRN

## 2021-03-23 MED ORDER — SODIUM CHLORIDE 0.9 % IV BOLUS
1000.0000 mL | Freq: Once | INTRAVENOUS | Status: AC
  Administered 2021-03-23: 1000 mL via INTRAVENOUS

## 2021-03-23 MED ORDER — FAMOTIDINE 20 MG PO TABS
20.0000 mg | ORAL_TABLET | Freq: Two times a day (BID) | ORAL | 0 refills | Status: DC
Start: 2021-03-23 — End: 2021-05-01

## 2021-03-23 MED ORDER — SODIUM CHLORIDE 0.9 % IV BOLUS
1000.0000 mL | Freq: Once | INTRAVENOUS | Status: AC
Start: 2021-03-23 — End: 2021-03-23
  Administered 2021-03-23: 1000 mL via INTRAVENOUS

## 2021-03-23 NOTE — ED Triage Notes (Signed)
Pt reports has a cracked tooth on his left lower jaw. Pt unsure of how it happened, reports painful and thinks it maybe infected.

## 2021-03-23 NOTE — ED Notes (Addendum)
C/o extreme bottom left teeth pain. Patient reports taking 12 BC powders Tuesday 03/20/21 between 5pm and 7am. Reports Wednesday 03/21/21 feeling his heart racing and couldn't do any lifting at work. Possible chills/fever through the night.

## 2021-03-23 NOTE — ED Notes (Signed)
Patient given cup of water.

## 2021-03-23 NOTE — ED Provider Notes (Signed)
[facility] Emergency Department Provider Note  ____________________________________________  Time seen: Approximately 10:48 AM  I have reviewed the triage vital signs and the nursing notes.   HISTORY  Chief Complaint Dental Injury    HPI Dillon Lewis is a 55 y.o. male with a history of hypertension who comes the ED complaining of left lower jaw dental pain.  This is been ongoing for the past 4 days.  No acute injury that he knows of.  He does note that he has poor dentition chronically.  No fever or chills.  He has a loss of appetite, but states that he has been drinking a lot of water but just not feeling like eating much.  No vomiting or diarrhea.  No body aches, no trouble breathing or swallowing.  Symptoms are constant, dental pain is severe, nonradiating, no alleviating factors.  He does note that over the last 24 hours he started getting lightheaded with standing and feeling like he is going to pass out.  History reviewed. No pertinent past medical history.   Patient Active Problem List   Diagnosis Date Noted   Hyperlipidemia 08/25/2020   Obesity (BMI 30-39.9) 08/10/2020   Essential hypertension 07/14/2020     History reviewed. No pertinent surgical history.   Prior to Admission medications   Medication Sig Start Date End Date Taking? Authorizing Provider  famotidine (PEPCID) 20 MG tablet Take 1 tablet (20 mg total) by mouth 2 (two) times daily. 03/23/21  Yes Sharman Cheek, MD  ketorolac (TORADOL) 10 MG tablet Take 1 tablet (10 mg total) by mouth every 6 (six) hours as needed for moderate pain. 03/23/21  Yes Sharman Cheek, MD  lisinopril-hydrochlorothiazide (ZESTORETIC) 20-12.5 MG tablet TAKE 2 TABLETS BY MOUTH EVERY DAY 01/09/21   Corky Downs, MD  lovastatin (MEVACOR) 20 MG tablet Take 1 tablet (20 mg total) by mouth at bedtime. 02/16/21   Irish Lack, FNP     Allergies Patient has no allergy information on record.   No family  history on file.  Social History Social History   Tobacco Use   Smoking status: Never   Smokeless tobacco: Never  Substance Use Topics   Alcohol use: Not Currently    Review of Systems  Constitutional:   No fever or chills.  ENT:   No sore throat. No rhinorrhea. Cardiovascular:   No chest pain or syncope. Respiratory:   No dyspnea or cough. Gastrointestinal:   Negative for abdominal pain, vomiting and diarrhea.  Musculoskeletal:   Negative for focal pain or swelling All other systems reviewed and are negative except as documented above in ROS and HPI.  ____________________________________________   PHYSICAL EXAM:  VITAL SIGNS: ED Triage Vitals  Enc Vitals Group     BP 03/23/21 0857 (!) 65/54     Pulse Rate 03/23/21 0857 (!) 130     Resp 03/23/21 0857 18     Temp 03/23/21 0857 98.3 F (36.8 C)     Temp Source 03/23/21 0857 Oral     SpO2 03/23/21 0857 100 %     Weight 03/23/21 0853 242 lb 8.1 oz (110 kg)     Height 03/23/21 0853 5\' 11"  (1.803 m)     Head Circumference --      Peak Flow --      Pain Score 03/23/21 0853 8     Pain Loc --      Pain Edu? --      Excl. in GC? --     Vital  signs reviewed, nursing assessments reviewed.   Constitutional:   Alert and oriented. Non-toxic appearance. Eyes:   Conjunctivae are normal. EOMI. PERRL. ENT      Head:   Normocephalic and atraumatic.      Nose:   Normal      Mouth/Throat: No tongue swelling or floor of mouth swelling.  There is poor dentition, widespread dental decay with multiple fractured teeth and teeth decayed down to stumps.  No gingival swelling or drainage.  No focal tenderness.  Floor of mouth is soft..      Neck:   No meningismus. Full ROM.  No submandibular or submental mass. Hematological/Lymphatic/Immunilogical:   No cervical lymphadenopathy. Cardiovascular:   RRR. Symmetric bilateral radial and DP pulses.  No murmurs. Cap refill less than 2 seconds. Respiratory:   Normal respiratory effort without  tachypnea/retractions. Breath sounds are clear and equal bilaterally. No wheezes/rales/rhonchi. Gastrointestinal:   Soft and nontender. Non distended. There is no CVA tenderness.  No rebound, rigidity, or guarding. Genitourinary:   deferred Musculoskeletal:   Normal range of motion in all extremities. No joint effusions.  No lower extremity tenderness.  No edema. Neurologic:   Normal speech and language.  Motor grossly intact. No acute focal neurologic deficits are appreciated.  Skin:    Skin is warm, dry and intact. No rash noted.  No petechiae, purpura, or bullae.  ____________________________________________    LABS (pertinent positives/negatives) (all labs ordered are listed, but only abnormal results are displayed) Labs Reviewed  CBC WITH DIFFERENTIAL/PLATELET - Abnormal; Notable for the following components:      Result Value   WBC 11.8 (*)    MCV 102.5 (*)    MCH 36.6 (*)    Neutro Abs 10.3 (*)    Abs Immature Granulocytes 0.09 (*)    All other components within normal limits  ACETAMINOPHEN LEVEL - Abnormal; Notable for the following components:   Acetaminophen (Tylenol), Serum <10 (*)    All other components within normal limits  COMPREHENSIVE METABOLIC PANEL - Abnormal; Notable for the following components:   Sodium 130 (*)    Chloride 96 (*)    CO2 21 (*)    Glucose, Bld 123 (*)    BUN 30 (*)    Creatinine, Ser 2.91 (*)    AST 59 (*)    GFR, Estimated 25 (*)    All other components within normal limits  SALICYLATE LEVEL - Abnormal; Notable for the following components:   Salicylate Lvl <7.0 (*)    All other components within normal limits   ____________________________________________   EKG  Interpreted by me Sinus tachycardia rate 113.  Normal axis and intervals.  Normal QRS ST segments and T waves.  No ischemic changes.  ____________________________________________    RADIOLOGY  No results  found.  ____________________________________________   PROCEDURES Procedures  ____________________________________________    CLINICAL IMPRESSION / ASSESSMENT AND PLAN / ED COURSE  Medications ordered in the ED: Medications  sodium chloride 0.9 % bolus 1,000 mL (0 mLs Intravenous Stopped 03/23/21 1109)  ketorolac (TORADOL) 30 MG/ML injection 15 mg (15 mg Intravenous Given 03/23/21 0957)  pantoprazole (PROTONIX) injection 40 mg (40 mg Intravenous Given 03/23/21 0954)  sodium chloride 0.9 % bolus 1,000 mL (0 mLs Intravenous Stopped 03/23/21 1237)    Pertinent labs & imaging results that were available during my care of the patient were reviewed by me and considered in my medical decision making (see chart for details).  Dillon Lewis was evaluated in Emergency Department  on 03/23/2021 for the symptoms described in the history of present illness. He was evaluated in the context of the global COVID-19 pandemic, which necessitated consideration that the patient might be at risk for infection with the SARS-CoV-2 virus that causes COVID-19. Institutional protocols and algorithms that pertain to the evaluation of patients at risk for COVID-19 are in a state of rapid change based on information released by regulatory bodies including the CDC and federal and state organizations. These policies and algorithms were followed during the patient's care in the ED.   Patient presents with dental pain.  Found to be tachycardic and hypotensive which I think is due to dehydration from very poor oral intake over the last few days with his mouth pain.  No evidence of abscess, PTA, RPA, airway occlusion, arterial dissection, venous thrombosis.  Will give IV Toradol and check labs while giving fluids for hydration and reassess.  Clinical Course as of 03/23/21 1442  Fri Mar 23, 2021  1313 Patient feels much better.  Blood pressures currently 110/50.  MAP of 70.  I think the AKI seen on labs is due to dehydration  and poor food intake but he states that his pain is now minimal after Toradol.  Offered admission, but patient feels like he is well enough to go home now.  Will do p.o. trial and orthostatic vitals. [PS]    Clinical Course User Index [PS] Sharman Cheek, MD     ----------------------------------------- 2:43 PM on 03/23/2021 ----------------------------------------- Orthostatics stable.  Tolerating p.o.  Stable for discharge.  Return precautions discussed.  Continue NSAIDs and Pepcid at home.  Resources for dentistry provided.  ____________________________________________   FINAL CLINICAL IMPRESSION(S) / ED DIAGNOSES    Final diagnoses:  Pain due to dental caries  Dehydration  AKI (acute kidney injury) [facility])     ED Discharge Orders          Ordered    ketorolac (TORADOL) 10 MG tablet  Every 6 hours PRN        03/23/21 1441    famotidine (PEPCID) 20 MG tablet  2 times daily        03/23/21 1441            Portions of this note were generated with dragon dictation software. Dictation errors may occur despite best attempts at proofreading.    Sharman Cheek, MD 03/23/21 [phone]

## 2021-03-28 ENCOUNTER — Other Ambulatory Visit: Payer: Self-pay

## 2021-03-28 DIAGNOSIS — F17210 Nicotine dependence, cigarettes, uncomplicated: Secondary | ICD-10-CM | POA: Diagnosis not present

## 2021-03-28 DIAGNOSIS — Z79899 Other long term (current) drug therapy: Secondary | ICD-10-CM | POA: Diagnosis not present

## 2021-03-28 DIAGNOSIS — K047 Periapical abscess without sinus: Secondary | ICD-10-CM | POA: Diagnosis not present

## 2021-03-28 DIAGNOSIS — I10 Essential (primary) hypertension: Secondary | ICD-10-CM | POA: Insufficient documentation

## 2021-03-28 DIAGNOSIS — K029 Dental caries, unspecified: Secondary | ICD-10-CM | POA: Diagnosis not present

## 2021-03-28 DIAGNOSIS — K0889 Other specified disorders of teeth and supporting structures: Secondary | ICD-10-CM | POA: Diagnosis not present

## 2021-03-28 NOTE — ED Triage Notes (Signed)
Pt presents to ER c/o dental pain in left lower jaw.  Pt states he has a cracked tooth and has been taking ibuprofen with some relief but today pain became unbearable.  Pt has some swelling noted to left side of jaw at this time.

## 2021-03-29 ENCOUNTER — Emergency Department
Admission: EM | Admit: 2021-03-29 | Discharge: 2021-03-29 | Disposition: A | Discharge status: Home / Self Care | Payer: BC Managed Care – PPO | Attending: Emergency Medicine | Admitting: Emergency Medicine

## 2021-03-29 DIAGNOSIS — K047 Periapical abscess without sinus: Secondary | ICD-10-CM

## 2021-03-29 DIAGNOSIS — K0889 Other specified disorders of teeth and supporting structures: Secondary | ICD-10-CM

## 2021-03-29 DIAGNOSIS — K029 Dental caries, unspecified: Secondary | ICD-10-CM

## 2021-03-29 HISTORY — DX: Essential (primary) hypertension: I10

## 2021-03-29 MED ORDER — IBUPROFEN 800 MG PO TABS: 800.0000 mg | ORAL_TABLET | Freq: Three times a day (TID) | ORAL | 0 refills | Status: DC | PRN

## 2021-03-29 MED ORDER — OXYCODONE-ACETAMINOPHEN 5-325 MG PO TABS: 1.0000 | ORAL_TABLET | ORAL | 0 refills | Status: DC | PRN

## 2021-03-29 MED ORDER — AMOXICILLIN 500 MG PO CAPS: 500.0000 mg | ORAL_CAPSULE | Freq: Three times a day (TID) | ORAL | 0 refills | Status: DC

## 2021-03-29 MED ORDER — LIDOCAINE VISCOUS HCL 2 % MT SOLN
15.0000 mL | Freq: Once | OROMUCOSAL | Status: AC
  Administered 2021-03-29: 15 mL via OROMUCOSAL
  Filled 2021-03-29: qty 15

## 2021-03-29 MED ORDER — OXYCODONE-ACETAMINOPHEN 5-325 MG PO TABS
1.0000 | ORAL_TABLET | Freq: Once | ORAL | Status: AC
Start: 2021-03-29 — End: 2021-03-29
  Administered 2021-03-29: 1 via ORAL
  Filled 2021-03-29: qty 1

## 2021-03-29 MED ORDER — AMOXICILLIN 500 MG PO CAPS
500.0000 mg | ORAL_CAPSULE | Freq: Once | ORAL | Status: AC
  Administered 2021-03-29: 500 mg via ORAL
  Filled 2021-03-29: qty 1

## 2021-03-29 MED ORDER — KETOROLAC TROMETHAMINE 60 MG/2ML IM SOLN
30.0000 mg | Freq: Once | INTRAMUSCULAR | Status: AC
  Administered 2021-03-29: 30 mg via INTRAMUSCULAR
  Filled 2021-03-29: qty 2

## 2021-03-29 NOTE — ED Provider Notes (Signed)
[facility] Emergency Department Provider Note   ____________________________________________   Event Date/Time   First MD Initiated Contact with Patient 03/29/21 0045     (approximate)  I have reviewed the triage vital signs and the nursing notes.   HISTORY  Chief Complaint Dental Pain    HPI Dillon Lewis is a 55 y.o. male who presents to the ED from home with a chief complaint of dentalgia.  Patient reports he has a dentist but he is stubborn and does not see the dentist.  Reports left lower tooth pain with swelling for 1 week.  Has a cracked tooth and has been taking ibuprofen with little relief.  Denies fever, chills, vomiting, dizziness.  Seen in the ED 03/23/2021 for similar but was tachycardic secondary to dehydration so received IV fluids, Toradol.  No antibiotics     Past Medical History:  Diagnosis Date   Hypertension     Patient Active Problem List   Diagnosis Date Noted   Hyperlipidemia 08/25/2020   Obesity (BMI 30-39.9) 08/10/2020   Essential hypertension 07/14/2020    History reviewed. No pertinent surgical history.  Prior to Admission medications   Medication Sig Start Date End Date Taking? Authorizing Provider  amoxicillin (AMOXIL) 500 MG capsule Take 1 capsule (500 mg total) by mouth 3 (three) times daily. 03/29/21  Yes Irean Hong, MD  ibuprofen (ADVIL) 800 MG tablet Take 1 tablet (800 mg total) by mouth every 8 (eight) hours as needed for moderate pain. 03/29/21  Yes Irean Hong, MD  oxyCODONE-acetaminophen (PERCOCET/ROXICET) 5-325 MG tablet Take 1 tablet by mouth every 4 (four) hours as needed for severe pain. 03/29/21  Yes Irean Hong, MD  famotidine (PEPCID) 20 MG tablet Take 1 tablet (20 mg total) by mouth 2 (two) times daily. 03/23/21   Sharman Cheek, MD  ketorolac (TORADOL) 10 MG tablet Take 1 tablet (10 mg total) by mouth every 6 (six) hours as needed for moderate pain. 03/23/21   Sharman Cheek, MD   lisinopril-hydrochlorothiazide (ZESTORETIC) 20-12.5 MG tablet TAKE 2 TABLETS BY MOUTH EVERY DAY 01/09/21   Corky Downs, MD  lovastatin (MEVACOR) 20 MG tablet Take 1 tablet (20 mg total) by mouth at bedtime. 02/16/21   Irish Lack, FNP    Allergies Patient has no known allergies.  History reviewed. No pertinent family history.  Social History Social History   Tobacco Use   Smoking status: Every Day    Packs/day: 0.50    Types: Cigarettes   Smokeless tobacco: Never  Substance Use Topics   Alcohol use: Yes    Comment: occasional   Drug use: Never    Review of Systems  Constitutional: No fever/chills Eyes: No visual changes. ENT: Positive for dentalgia.  No sore throat. Cardiovascular: Denies chest pain. Respiratory: Denies shortness of breath. Gastrointestinal: No abdominal pain.  No nausea, no vomiting.  No diarrhea.  No constipation. Genitourinary: Negative for dysuria. Musculoskeletal: Negative for back pain. Skin: Negative for rash. Neurological: Negative for headaches, focal weakness or numbness.   ____________________________________________   PHYSICAL EXAM:  VITAL SIGNS: ED Triage Vitals  Enc Vitals Group     BP 03/28/21 2356 (!) 128/111     Pulse Rate 03/28/21 2356 87     Resp 03/28/21 2356 18     Temp 03/28/21 2356 97.9 F (36.6 C)     Temp Source 03/28/21 2356 Oral     SpO2 03/28/21 2356 99 %     Weight 03/28/21 2357 245  lb (111.1 kg)     Height 03/28/21 2357 5\' 11"  (1.803 m)     Head Circumference --      Peak Flow --      Pain Score 03/28/21 2357 8     Pain Loc --      Pain Edu? --      Excl. in GC? --     Constitutional: Alert and oriented. Well appearing and in mild acute distress. Eyes: Conjunctivae are normal. PERRL. EOMI. Head: Atraumatic. Nose: No congestion/rhinnorhea. Mouth/Throat: Mucous membranes are moist.  Widespread dental caries.  Left lower molar with pre-existing cracked tooth which is tender to palpation with tongue blade  with surrounding gum inflammation consistent with dental abscess. Neck: No stridor.   Cardiovascular: Normal rate, regular rhythm. Grossly normal heart sounds.  Good peripheral circulation. Respiratory: Normal respiratory effort.  No retractions. Lungs CTAB. Gastrointestinal: Soft and nontender. No distention. No abdominal bruits. No CVA tenderness. Musculoskeletal: No lower extremity tenderness nor edema.  No joint effusions. Neurologic:  Normal speech and language. No gross focal neurologic deficits are appreciated. No gait instability. Skin:  Skin is warm, dry and intact. No rash noted. Psychiatric: Mood and affect are normal. Speech and behavior are normal.  ____________________________________________   LABS (all labs ordered are listed, but only abnormal results are displayed)  Labs Reviewed - No data to display ____________________________________________  EKG  None ____________________________________________  RADIOLOGY I, [place] C[place] J, personally viewed and evaluated these images (plain radiographs) as part of my medical decision making, as well as reviewing the written report by the radiologist.  ED MD interpretation: None  Official radiology report(s): No results found.  ____________________________________________   PROCEDURES  Procedure(s) performed (including Critical Care):  Procedures   ____________________________________________   INITIAL IMPRESSION / ASSESSMENT AND PLAN / ED COURSE  As part of my medical decision making, I reviewed the following data within the electronic MEDICAL RECORD NUMBER Nursing notes reviewed and incorporated, Old chart reviewed, Notes from prior ED visits, and [place] Controlled Substance Database     55 year old male presenting with dental abscess.  Will treat with amoxicillin, lidocaine rinse, IM Toradol, Percocet.  He has a 40 which he will see.  Noted blood pressure elevation which is most likely secondary to pain.  Encourage  patient to recheck once his tooth is feeling better.  Strict return precautions given.  Patient verbalizes understanding agrees with plan of care.      ____________________________________________   FINAL CLINICAL IMPRESSION(S) / ED DIAGNOSES  Final diagnoses:  Dental abscess  Pain due to dental caries  Toothache     ED Discharge Orders          Ordered    amoxicillin (AMOXIL) 500 MG capsule  3 times daily        03/29/21 0107    oxyCODONE-acetaminophen (PERCOCET/ROXICET) 5-325 MG tablet  Every 4 hours PRN        03/29/21 0107    ibuprofen (ADVIL) 800 MG tablet  Every 8 hours PRN        03/29/21 0108             Note:  This document was prepared using Dragon voice recognition software and may include unintentional dictation errors.    03/31/21, MD 03/29/21 [phone]

## 2021-03-29 NOTE — Discharge Instructions (Addendum)
1. Take antibiotic as prescribed (Amoxicillin 500mg three times daily x 7 days). °2. Take pain medicines as needed (Motrin/Percocet #15). °3. Return to the ER for worsening symptoms, persistent vomiting, fever, difficulty breathing or other concerns. ° °

## 2021-05-01 ENCOUNTER — Other Ambulatory Visit: Payer: Self-pay

## 2021-05-01 ENCOUNTER — Encounter: Payer: Self-pay | Admitting: Internal Medicine

## 2021-05-01 ENCOUNTER — Ambulatory Visit (INDEPENDENT_AMBULATORY_CARE_PROVIDER_SITE_OTHER): Payer: BC Managed Care – PPO | Admitting: Internal Medicine

## 2021-05-01 VITALS — BP 96/57 | HR 78 | Ht 71.0 in | Wt 232.9 lb

## 2021-05-01 DIAGNOSIS — E669 Obesity, unspecified: Secondary | ICD-10-CM | POA: Diagnosis not present

## 2021-05-01 DIAGNOSIS — I10 Essential (primary) hypertension: Secondary | ICD-10-CM | POA: Diagnosis not present

## 2021-05-01 DIAGNOSIS — N1832 Chronic kidney disease, stage 3b: Secondary | ICD-10-CM | POA: Diagnosis not present

## 2021-05-01 DIAGNOSIS — E785 Hyperlipidemia, unspecified: Secondary | ICD-10-CM | POA: Diagnosis not present

## 2021-05-01 MED ORDER — ATENOLOL 25 MG PO TABS: 25.0000 mg | ORAL_TABLET | Freq: Every day | ORAL | 3 refills | Status: DC

## 2021-05-01 NOTE — Assessment & Plan Note (Signed)

## 2021-05-01 NOTE — Assessment & Plan Note (Signed)
Hypercholesterolemia  I advised the patient to follow Mediterranean diet This diet is rich in fruits vegetables and whole grain, and This diet is also rich in fish and lean meat Patient should also eat a handful of almonds or walnuts daily Recent heart study indicated that average follow-up on this kind of diet reduces the cardiovascular mortality by 50 to 70%== 

## 2021-05-01 NOTE — Assessment & Plan Note (Signed)

## 2021-05-01 NOTE — Progress Notes (Signed)
Established Patient Office Visit  Subjective:  Patient ID: Dillon Lewis, male    DOB: Oct 31, 1965  Age: 55 y.o. MRN: [id number]  CC:  Chief Complaint  Patient presents with   Follow-up    HPI  Dillon Lewis presents for check up  Past Medical History:  Diagnosis Date   Hypertension     History reviewed. No pertinent surgical history.  History reviewed. No pertinent family history.  Social History   Socioeconomic History   Marital status: Single    Spouse name: Not on file   Number of children: Not on file   Years of education: Not on file   Highest education level: Not on file  Occupational History   Not on file  Tobacco Use   Smoking status: Every Day    Packs/day: 0.50    Types: Cigarettes   Smokeless tobacco: Never  Substance and Sexual Activity   Alcohol use: Yes    Comment: occasional   Drug use: Never   Sexual activity: Not on file  Other Topics Concern   Not on file  Social History Narrative   Not on file   Social Determinants of Health   Financial Resource Strain: Not on file  Food Insecurity: Not on file  Transportation Needs: Not on file  Physical Activity: Not on file  Stress: Not on file  Social Connections: Not on file  Intimate Partner Violence: Not on file     Current Outpatient Medications:    atenolol (TENORMIN) 25 MG tablet, Take 1 tablet (25 mg total) by mouth daily., Disp: 90 tablet, Rfl: 3   ibuprofen (ADVIL) 800 MG tablet, Take 1 tablet (800 mg total) by mouth every 8 (eight) hours as needed for moderate pain., Disp: 15 tablet, Rfl: 0   lovastatin (MEVACOR) 20 MG tablet, Take 1 tablet (20 mg total) by mouth at bedtime., Disp: 90 tablet, Rfl: 3   Allergies  Allergen Reactions   Lisinopril-Hydrochlorothiazide Other (See Comments)    hypotension    ROS Review of Systems  Constitutional: Negative.   HENT: Negative.    Eyes: Negative.   Respiratory: Negative.    Cardiovascular: Negative.   Gastrointestinal: Negative.    Endocrine: Negative.   Genitourinary: Negative.   Musculoskeletal: Negative.   Skin: Negative.   Allergic/Immunologic: Negative.   Neurological: Negative.   Hematological: Negative.   Psychiatric/Behavioral: Negative.    All other systems reviewed and are negative.    Objective:    Physical Exam Vitals reviewed.  Constitutional:      Appearance: Normal appearance.  HENT:     Mouth/Throat:     Mouth: Mucous membranes are moist.  Eyes:     Pupils: Pupils are equal, round, and reactive to light.  Neck:     Vascular: No carotid bruit.  Cardiovascular:     Rate and Rhythm: Normal rate and regular rhythm.     Pulses: Normal pulses.     Heart sounds: Normal heart sounds.  Pulmonary:     Effort: Pulmonary effort is normal.     Breath sounds: Normal breath sounds.  Abdominal:     General: Bowel sounds are normal.     Palpations: Abdomen is soft. There is no hepatomegaly, splenomegaly or mass.     Tenderness: There is no abdominal tenderness.     Hernia: No hernia is present.  Musculoskeletal:     Cervical back: Neck supple.     Right lower leg: No edema.     Left lower  leg: No edema.  Skin:    Findings: No rash.  Neurological:     Mental Status: He is alert and oriented to person, place, and time.     Motor: No weakness.  Psychiatric:        Mood and Affect: Mood normal.        Behavior: Behavior normal.    BP (!) 96/57   Pulse 78   Ht 5\' 11"  (1.803 m)   Wt 232 lb 14.4 oz (105.6 kg)   BMI 32.48 kg/m  Wt Readings from Last 3 Encounters:  05/01/21 232 lb 14.4 oz (105.6 kg)  03/28/21 245 lb (111.1 kg)  03/23/21 242 lb 8.1 oz (110 kg)     Health Maintenance Due  Topic Date Due   COVID-19 Vaccine (1) Never done   Pneumococcal Vaccine 98-63 Years old (1 - PCV) Never done   HIV Screening  Never done   Hepatitis C Screening  Never done   TETANUS/TDAP  Never done   COLONOSCOPY (Pts 45-5yrs Insurance coverage will need to be confirmed)  Never done   Zoster  Vaccines- Shingrix (1 of 2) Never done   INFLUENZA VACCINE  04/09/2021    There are no preventive care reminders to display for this patient.  Lab Results  Component Value Date   TSH 1.66 08/10/2020   Lab Results  Component Value Date   WBC 11.8 (H) 03/23/2021   HGB 16.4 03/23/2021   HCT 45.9 03/23/2021   MCV 102.5 (H) 03/23/2021   PLT 234 03/23/2021   Lab Results  Component Value Date   NA 130 (L) 03/23/2021   K 3.8 03/23/2021   CO2 21 (L) 03/23/2021   GLUCOSE 123 (H) 03/23/2021   BUN 30 (H) 03/23/2021   CREATININE 2.91 (H) 03/23/2021   BILITOT 0.8 03/23/2021   ALKPHOS 89 03/23/2021   AST 59 (H) 03/23/2021   ALT 40 03/23/2021   PROT 7.9 03/23/2021   ALBUMIN 3.9 03/23/2021   CALCIUM 9.0 03/23/2021   ANIONGAP 13 03/23/2021   Lab Results  Component Value Date   CHOL 244 (H) 08/10/2020   Lab Results  Component Value Date   HDL 66 08/10/2020   Lab Results  Component Value Date   LDLCALC 151 (H) 08/10/2020   Lab Results  Component Value Date   TRIG 142 08/10/2020   Lab Results  Component Value Date   CHOLHDL 3.7 08/10/2020   No results found for: HGBA1C    Assessment & Plan:   Problem List Items Addressed This Visit       Cardiovascular and Mediastinum   Essential hypertension - Primary     Patient denies any chest pain or shortness of breath there is no history of palpitation or paroxysmal nocturnal dyspnea   patient was advised to follow low-salt low-cholesterol diet    ideally I want to keep systolic blood pressure below 14/10/2019 mmHg, patient was asked to check blood pressure one times a week and give me a report on that.  Patient will be follow-up in 3 months  or earlier as needed, patient will call me back for any change in the cardiovascular symptoms Patient was advised to buy a book from local bookstore concerning blood pressure and read several chapters  every day.  This will be supplemented by some of the material we will give him from the office.   Patient should also utilize other resources like YouTube and Internet to learn more about the blood pressure and the diet.  Relevant Medications   atenolol (TENORMIN) 25 MG tablet     Genitourinary   Stage 3b chronic kidney disease (HCC)    Patient creatinine was elevated.  We will check it again tomorrow he was advised to stop using ibuprofen, naproxen or any other NSAIDs        Other   Obesity (BMI 30-39.9)    - I encouraged the patient to lose weight.  - I educated them on making healthy dietary choices including eating more fruits and vegetables and less fried foods. - I encouraged the patient to exercise more, and educated on the benefits of exercise including weight loss, diabetes prevention, and hypertension prevention.   Dietary counseling with a registered dietician  Referral to a weight management support group (e.g. Weight Watchers, Overeaters Anonymous)  If your BMI is greater than 29 or you have gained more than 15 pounds you should work on weight loss.  Attend a healthy cooking class       Hyperlipidemia    Hypercholesterolemia  I advised the patient to follow Mediterranean diet This diet is rich in fruits vegetables and whole grain, and This diet is also rich in fish and lean meat Patient should also eat a handful of almonds or walnuts daily Recent heart study indicated that average follow-up on this kind of diet reduces the cardiovascular mortality by 50 to 70%==      Relevant Medications   atenolol (TENORMIN) 25 MG tablet    Meds ordered this encounter  Medications   atenolol (TENORMIN) 25 MG tablet    Sig: Take 1 tablet (25 mg total) by mouth daily.    Dispense:  90 tablet    Refill:  3    Follow-up: No follow-ups on file.    Corky Downs, MD

## 2021-05-01 NOTE — Assessment & Plan Note (Signed)
Patient creatinine was elevated.  We will check it again tomorrow he was advised to stop using ibuprofen, naproxen or any other NSAIDs

## 2021-05-02 ENCOUNTER — Other Ambulatory Visit (INDEPENDENT_AMBULATORY_CARE_PROVIDER_SITE_OTHER): Payer: BC Managed Care – PPO

## 2021-05-02 DIAGNOSIS — N1832 Chronic kidney disease, stage 3b: Secondary | ICD-10-CM | POA: Diagnosis not present

## 2021-05-02 LAB — BASIC METABOLIC PANEL WITH GFR
BUN: 16 mg/dL (ref 7–25)
CO2: 22 mmol/L (ref 20–32)
Calcium: 8.9 mg/dL (ref 8.6–10.3)
Chloride: 96 mmol/L — ABNORMAL LOW (ref 98–110)
Creat: 1.06 mg/dL (ref 0.70–1.30)
Glucose, Bld: 78 mg/dL (ref 65–99)
Potassium: 4.6 mmol/L (ref 3.5–5.3)
Sodium: 128 mmol/L — ABNORMAL LOW (ref 135–146)
eGFR: 84 mL/min/{1.73_m2} (ref >=60)

## 2021-05-12 ENCOUNTER — Other Ambulatory Visit: Payer: Self-pay | Admitting: Internal Medicine

## 2021-05-29 ENCOUNTER — Encounter: Payer: Self-pay | Admitting: Internal Medicine

## 2021-05-29 ENCOUNTER — Ambulatory Visit (INDEPENDENT_AMBULATORY_CARE_PROVIDER_SITE_OTHER): Payer: BC Managed Care – PPO | Admitting: Internal Medicine

## 2021-05-29 ENCOUNTER — Other Ambulatory Visit: Payer: Self-pay

## 2021-05-29 VITALS — BP 130/93 | HR 65 | Ht 71.0 in | Wt 228.6 lb

## 2021-05-29 DIAGNOSIS — E785 Hyperlipidemia, unspecified: Secondary | ICD-10-CM

## 2021-05-29 DIAGNOSIS — E669 Obesity, unspecified: Secondary | ICD-10-CM | POA: Diagnosis not present

## 2021-05-29 DIAGNOSIS — I10 Essential (primary) hypertension: Secondary | ICD-10-CM | POA: Diagnosis not present

## 2021-05-29 DIAGNOSIS — M25432 Effusion, left wrist: Secondary | ICD-10-CM | POA: Diagnosis not present

## 2021-05-29 DIAGNOSIS — N1832 Chronic kidney disease, stage 3b: Secondary | ICD-10-CM | POA: Diagnosis not present

## 2021-05-29 MED ORDER — METOPROLOL SUCCINATE ER 25 MG PO TB24: 25.0000 mg | ORAL_TABLET | Freq: Every day | ORAL | 3 refills | Status: DC

## 2021-05-29 NOTE — Progress Notes (Signed)
Established Patient Office Visit  Subjective:  Patient ID: Dillon Lewis, male    DOB: November 02, 1965  Age: 55 y.o. MRN: [id number]  CC:  Chief Complaint  Patient presents with   Follow-up    HPI  Dillon Lewis presents for swelling of the left wrist, he does not want to take atenolol, so we will start him on metoprolol 25 mg p.o. daily we will also check his uric acid CRP, rheumatoid factor, patient denies any chest pain or shortness of breath.  Blood pressures under control.  He is worried about gout.  Does not smoke does not drink.  Past Medical History:  Diagnosis Date   Hypertension     History reviewed. No pertinent surgical history.  History reviewed. No pertinent family history.  Social History   Socioeconomic History   Marital status: Single    Spouse name: Not on file   Number of children: Not on file   Years of education: Not on file   Highest education level: Not on file  Occupational History   Not on file  Tobacco Use   Smoking status: Every Day    Packs/day: 0.50    Types: Cigarettes   Smokeless tobacco: Never  Substance and Sexual Activity   Alcohol use: Yes    Comment: occasional   Drug use: Never   Sexual activity: Not on file  Other Topics Concern   Not on file  Social History Narrative   Not on file   Social Determinants of Health   Financial Resource Strain: Not on file  Food Insecurity: Not on file  Transportation Needs: Not on file  Physical Activity: Not on file  Stress: Not on file  Social Connections: Not on file  Intimate Partner Violence: Not on file     Current Outpatient Medications:    metoprolol succinate (TOPROL-XL) 25 MG 24 hr tablet, Take 1 tablet (25 mg total) by mouth daily., Disp: 30 tablet, Rfl: 3   ibuprofen (ADVIL) 800 MG tablet, Take 1 tablet (800 mg total) by mouth every 8 (eight) hours as needed for moderate pain., Disp: 15 tablet, Rfl: 0   lisinopril-hydrochlorothiazide (ZESTORETIC) 20-12.5 MG tablet, TAKE 2  TABLETS BY MOUTH EVERY DAY, Disp: 180 tablet, Rfl: 1   lovastatin (MEVACOR) 20 MG tablet, Take 1 tablet (20 mg total) by mouth at bedtime., Disp: 90 tablet, Rfl: 3   Allergies  Allergen Reactions   Lisinopril-Hydrochlorothiazide Other (See Comments)    hypotension    ROS Review of Systems  Constitutional: Negative.   HENT: Negative.    Eyes: Negative.   Respiratory: Negative.    Cardiovascular: Negative.   Gastrointestinal: Negative.   Endocrine: Negative.   Genitourinary: Negative.   Musculoskeletal: Negative.   Skin: Negative.   Allergic/Immunologic: Negative.   Neurological: Negative.   Hematological: Negative.   Psychiatric/Behavioral: Negative.    All other systems reviewed and are negative.    Objective:    Physical Exam Vitals reviewed.  Constitutional:      Appearance: Normal appearance.  HENT:     Mouth/Throat:     Mouth: Mucous membranes are moist.  Eyes:     Pupils: Pupils are equal, round, and reactive to light.  Neck:     Vascular: No carotid bruit.  Cardiovascular:     Rate and Rhythm: Normal rate and regular rhythm.     Pulses: Normal pulses.     Heart sounds: Normal heart sounds.  Pulmonary:     Effort: Pulmonary effort is  normal.     Breath sounds: Normal breath sounds.  Abdominal:     General: Bowel sounds are normal.     Palpations: Abdomen is soft. There is no hepatomegaly, splenomegaly or mass.     Tenderness: There is no abdominal tenderness.     Hernia: No hernia is present.  Musculoskeletal:     Cervical back: Neck supple.     Right lower leg: No edema.     Left lower leg: No edema.  Skin:    Findings: No rash.  Neurological:     Mental Status: He is alert and oriented to person, place, and time.     Motor: No weakness.  Psychiatric:        Mood and Affect: Mood normal.        Behavior: Behavior normal.    BP (!) 130/93   Pulse 65   Ht 5' 11"  (1.803 m)   Wt 228 lb 9.6 oz (103.7 kg)   BMI 31.88 kg/m  Wt Readings from Last  3 Encounters:  05/29/21 228 lb 9.6 oz (103.7 kg)  05/01/21 232 lb 14.4 oz (105.6 kg)  03/28/21 245 lb (111.1 kg)     Health Maintenance Due  Topic Date Due   COVID-19 Vaccine (1) Never done   HIV Screening  Never done   Hepatitis C Screening  Never done   TETANUS/TDAP  Never done   COLONOSCOPY (Pts 45-75yr Insurance coverage will need to be confirmed)  Never done   Zoster Vaccines- Shingrix (1 of 2) Never done   INFLUENZA VACCINE  Never done    There are no preventive care reminders to display for this patient.  Lab Results  Component Value Date   TSH 1.66 08/10/2020   Lab Results  Component Value Date   WBC 11.8 (H) 03/23/2021   HGB 16.4 03/23/2021   HCT 45.9 03/23/2021   MCV 102.5 (H) 03/23/2021   PLT 234 03/23/2021   Lab Results  Component Value Date   NA 128 (L) 05/02/2021   K 4.6 05/02/2021   CO2 22 05/02/2021   GLUCOSE 78 05/02/2021   BUN 16 05/02/2021   CREATININE 1.06 05/02/2021   BILITOT 0.8 03/23/2021   ALKPHOS 89 03/23/2021   AST 59 (H) 03/23/2021   ALT 40 03/23/2021   PROT 7.9 03/23/2021   ALBUMIN 3.9 03/23/2021   CALCIUM 8.9 05/02/2021   ANIONGAP 13 03/23/2021   EGFR 84 05/02/2021   Lab Results  Component Value Date   CHOL 244 (H) 08/10/2020   Lab Results  Component Value Date   HDL 66 08/10/2020   Lab Results  Component Value Date   LDLCALC 151 (H) 08/10/2020   Lab Results  Component Value Date   TRIG 142 08/10/2020   Lab Results  Component Value Date   CHOLHDL 3.7 08/10/2020   No results found for: HGBA1C    Assessment & Plan:   Problem List Items Addressed This Visit       Cardiovascular and Mediastinum   Essential hypertension - Primary     Patient denies any chest pain or shortness of breath there is no history of palpitation or paroxysmal nocturnal dyspnea   patient was advised to follow low-salt low-cholesterol diet    ideally I want to keep systolic blood pressure below 130 mmHg, patient was asked to check blood  pressure one times a week and give me a report on that.  Patient will be follow-up in 3 months  or earlier as needed,  patient will call me back for any change in the cardiovascular symptoms Patient was advised to buy a book from local bookstore concerning blood pressure and read several chapters  every day.  This will be supplemented by some of the material we will give him from the office.  Patient should also utilize other resources like YouTube and Internet to learn more about the blood pressure and the diet. Patient is not taking Advil anymore      Relevant Medications   metoprolol succinate (TOPROL-XL) 25 MG 24 hr tablet     Genitourinary   Stage 3b chronic kidney disease ([place])    Repeat kidney function shows improvement after he stopped taking his Advil.        Other   Obesity (BMI 30-39.9)    Behavioral modification strategies: increasing lean protein intake, decreasing simple carbohydrates, increasing vegetables, increasing water intake, decreasing eating out, no skipping meals, meal planning and cooking strategies, keeping healthy foods in the home and planning for success.      Hyperlipidemia    Hypercholesterolemia  I advised the patient to follow Mediterranean diet This diet is rich in fruits vegetables and whole grain, and This diet is also rich in fish and lean meat Patient should also eat a handful of almonds or walnuts daily Recent heart study indicated that average follow-up on this kind of diet reduces the cardiovascular mortality by 50 to 70%==      Relevant Medications   metoprolol succinate (TOPROL-XL) 25 MG 24 hr tablet    Meds ordered this encounter  Medications   metoprolol succinate (TOPROL-XL) 25 MG 24 hr tablet    Sig: Take 1 tablet (25 mg total) by mouth daily.    Dispense:  30 tablet    Refill:  3    Follow-up: No follow-ups on file.    Cletis Athens, MD

## 2021-05-29 NOTE — Assessment & Plan Note (Signed)
Behavioral modification strategies: increasing lean protein intake, decreasing simple carbohydrates, increasing vegetables, increasing water intake, decreasing eating out, no skipping meals, meal planning and cooking strategies, keeping healthy foods in the home and planning for success. 

## 2021-05-29 NOTE — Assessment & Plan Note (Signed)
Hypercholesterolemia  I advised the patient to follow Mediterranean diet This diet is rich in fruits vegetables and whole grain, and This diet is also rich in fish and lean meat Patient should also eat a handful of almonds or walnuts daily Recent heart study indicated that average follow-up on this kind of diet reduces the cardiovascular mortality by 50 to 70%== 

## 2021-05-29 NOTE — Addendum Note (Signed)
Addended by: Jobie Quaker on: 05/29/2021 11:34 AM   Modules accepted: Orders

## 2021-05-29 NOTE — Assessment & Plan Note (Signed)
Patient denies any chest pain or shortness of breath there is no history of palpitation or paroxysmal nocturnal dyspnea   patient was advised to follow low-salt low-cholesterol diet    ideally I want to keep systolic blood pressure below 716 mmHg, patient was asked to check blood pressure one times a week and give me a report on that.  Patient will be follow-up in 3 months  or earlier as needed, patient will call me back for any change in the cardiovascular symptoms Patient was advised to buy a book from local bookstore concerning blood pressure and read several chapters  every day.  This will be supplemented by some of the material we will give him from the office.  Patient should also utilize other resources like YouTube and Internet to learn more about the blood pressure and the diet. Patient is not taking Advil anymore

## 2021-05-29 NOTE — Assessment & Plan Note (Signed)
Repeat kidney function shows improvement after he stopped taking his Advil.

## 2021-05-31 LAB — URIC ACID: Uric Acid, Serum: 8.4 mg/dL — ABNORMAL HIGH (ref 4.0–8.0)

## 2021-05-31 LAB — C-REACTIVE PROTEIN: CRP: 1.4 mg/L (ref <8.0)

## 2021-05-31 LAB — ANA: Anti Nuclear Antibody (ANA): NEGATIVE

## 2021-05-31 LAB — RHEUMATOID FACTOR: Rheumatoid fact SerPl-aCnc: <14 IU/mL (ref <14)

## 2021-06-11 ENCOUNTER — Ambulatory Visit (INDEPENDENT_AMBULATORY_CARE_PROVIDER_SITE_OTHER): Payer: BC Managed Care – PPO | Admitting: Internal Medicine

## 2021-06-11 ENCOUNTER — Other Ambulatory Visit: Payer: Self-pay

## 2021-06-11 ENCOUNTER — Encounter: Payer: Self-pay | Admitting: Internal Medicine

## 2021-06-11 VITALS — BP 130/80 | HR 71 | Ht 71.0 in | Wt 232.0 lb

## 2021-06-11 DIAGNOSIS — E785 Hyperlipidemia, unspecified: Secondary | ICD-10-CM

## 2021-06-11 DIAGNOSIS — I10 Essential (primary) hypertension: Secondary | ICD-10-CM | POA: Diagnosis not present

## 2021-06-11 DIAGNOSIS — N1832 Chronic kidney disease, stage 3b: Secondary | ICD-10-CM | POA: Diagnosis not present

## 2021-06-11 DIAGNOSIS — E669 Obesity, unspecified: Secondary | ICD-10-CM | POA: Diagnosis not present

## 2021-06-11 DIAGNOSIS — M25561 Pain in right knee: Secondary | ICD-10-CM

## 2021-06-11 DIAGNOSIS — M25562 Pain in left knee: Secondary | ICD-10-CM

## 2021-06-11 MED ORDER — ROSUVASTATIN CALCIUM 10 MG PO TABS: 10.0000 mg | ORAL_TABLET | Freq: Every day | ORAL | 3 refills | Status: DC

## 2021-06-11 NOTE — Assessment & Plan Note (Signed)
Patient was advised to lose weight 

## 2021-06-11 NOTE — Assessment & Plan Note (Signed)
Blood pressure is stable on the present medication, patient is allergic to lisinopril

## 2021-06-11 NOTE — Assessment & Plan Note (Signed)
Hypercholesterolemia  I advised the patient to follow Mediterranean diet This diet is rich in fruits vegetables and whole grain, and This diet is also rich in fish and lean meat Patient should also eat a handful of almonds or walnuts daily Recent heart study indicated that average follow-up on this kind of diet reduces the cardiovascular mortality by 50 to 70%== lovastatin was stopped patient was started on Crestor 10 mg p.o. daily

## 2021-06-11 NOTE — Progress Notes (Signed)
Established Patient Office Visit  Subjective:  Patient ID: Dillon Lewis, male    DOB: 05-11-1966  Age: 55 y.o. MRN: [id number]  CC:  Chief Complaint  Patient presents with   Follow-up    10 follow up. Patient would like to discuss a change in medication.     HPI  Dillon Lewis presents for check up , c/o pain in both knees, uric acid 8.8.  Patient has a low sodium in the past so we will repeat the electrolyte and vitamin D level.  He will be referred to nephrologist and orthopedic surgeon Patient was advised to lose weight walk on a daily basis.  I stopped his lovastatin and started him on Crestor 10 mg p.o. daily his ANA is negative CRP is within normal limits, blood sugar is normal  Past Medical History:  Diagnosis Date   Hypertension     No past surgical history on file.  No family history on file.  Social History   Socioeconomic History   Marital status: Single    Spouse name: Not on file   Number of children: Not on file   Years of education: Not on file   Highest education level: Not on file  Occupational History   Not on file  Tobacco Use   Smoking status: Every Day    Packs/day: 0.50    Types: Cigarettes   Smokeless tobacco: Never  Substance and Sexual Activity   Alcohol use: Yes    Comment: occasional   Drug use: Never   Sexual activity: Not on file  Other Topics Concern   Not on file  Social History Narrative   Not on file   Social Determinants of Health   Financial Resource Strain: Not on file  Food Insecurity: Not on file  Transportation Needs: Not on file  Physical Activity: Not on file  Stress: Not on file  Social Connections: Not on file  Intimate Partner Violence: Not on file     Current Outpatient Medications:    metoprolol succinate (TOPROL-XL) 25 MG 24 hr tablet, Take 1 tablet (25 mg total) by mouth daily., Disp: 30 tablet, Rfl: 3   Allergies  Allergen Reactions   Lisinopril-Hydrochlorothiazide Other (See Comments)     hypotension    ROS Review of Systems  Constitutional: Negative.   HENT: Negative.    Eyes: Negative.   Respiratory: Negative.    Cardiovascular: Negative.   Gastrointestinal: Negative.   Endocrine: Negative.   Genitourinary: Negative.   Musculoskeletal: Negative.   Skin: Negative.   Allergic/Immunologic: Negative.   Neurological: Negative.   Hematological: Negative.   Psychiatric/Behavioral: Negative.    All other systems reviewed and are negative.    Objective:    Physical Exam Vitals reviewed.  Constitutional:      Appearance: Normal appearance.  HENT:     Mouth/Throat:     Mouth: Mucous membranes are moist.  Eyes:     Pupils: Pupils are equal, round, and reactive to light.  Neck:     Vascular: No carotid bruit.  Cardiovascular:     Rate and Rhythm: Normal rate and regular rhythm.     Pulses: Normal pulses.     Heart sounds: Normal heart sounds.  Pulmonary:     Effort: Pulmonary effort is normal.     Breath sounds: Normal breath sounds.  Abdominal:     General: Bowel sounds are normal.     Palpations: Abdomen is soft. There is no hepatomegaly, splenomegaly or mass.  Tenderness: There is no abdominal tenderness.     Hernia: No hernia is present.  Musculoskeletal:     Cervical back: Neck supple.     Right lower leg: No edema.     Left lower leg: No edema.  Skin:    Findings: No rash.  Neurological:     Mental Status: He is alert and oriented to person, place, and time.     Motor: No weakness.  Psychiatric:        Mood and Affect: Mood normal.        Behavior: Behavior normal.    BP 130/80   Pulse 71   Ht 5' 11"  (1.803 m)   Wt 232 lb (105.2 kg)   BMI 32.36 kg/m  Wt Readings from Last 3 Encounters:  06/11/21 232 lb (105.2 kg)  05/29/21 228 lb 9.6 oz (103.7 kg)  05/01/21 232 lb 14.4 oz (105.6 kg)     Health Maintenance Due  Topic Date Due   COVID-19 Vaccine (1) Never done   HIV Screening  Never done   Hepatitis C Screening  Never done    TETANUS/TDAP  Never done   COLONOSCOPY (Pts 45-53yr Insurance coverage will need to be confirmed)  Never done   Zoster Vaccines- Shingrix (1 of 2) Never done   INFLUENZA VACCINE  Never done    There are no preventive care reminders to display for this patient.  Lab Results  Component Value Date   TSH 1.66 08/10/2020   Lab Results  Component Value Date   WBC 11.8 (H) 03/23/2021   HGB 16.4 03/23/2021   HCT 45.9 03/23/2021   MCV 102.5 (H) 03/23/2021   PLT 234 03/23/2021   Lab Results  Component Value Date   NA 128 (L) 05/02/2021   K 4.6 05/02/2021   CO2 22 05/02/2021   GLUCOSE 78 05/02/2021   BUN 16 05/02/2021   CREATININE 1.06 05/02/2021   BILITOT 0.8 03/23/2021   ALKPHOS 89 03/23/2021   AST 59 (H) 03/23/2021   ALT 40 03/23/2021   PROT 7.9 03/23/2021   ALBUMIN 3.9 03/23/2021   CALCIUM 8.9 05/02/2021   ANIONGAP 13 03/23/2021   EGFR 84 05/02/2021   Lab Results  Component Value Date   CHOL 244 (H) 08/10/2020   Lab Results  Component Value Date   HDL 66 08/10/2020   Lab Results  Component Value Date   LDLCALC 151 (H) 08/10/2020   Lab Results  Component Value Date   TRIG 142 08/10/2020   Lab Results  Component Value Date   CHOLHDL 3.7 08/10/2020   No results found for: HGBA1C    Assessment & Plan:   Problem List Items Addressed This Visit       Cardiovascular and Mediastinum   Essential hypertension - Primary    Blood pressure is stable on the present medication, patient is allergic to lisinopril        Genitourinary   Stage 3b [place]    Patient will be referred to nephrologist        Other   Obesity (BMI 30-39.9)    Patient was advised to lose weight.      Hyperlipidemia    Hypercholesterolemia  I advised the patient to follow Mediterranean diet This diet is rich in fruits vegetables and whole grain, and This diet is also rich in fish and lean meat Patient should also eat a handful of almonds or walnuts  daily Recent heart study indicated that average follow-up  on this kind of diet reduces the cardiovascular mortality by 50 to 70%== lovastatin was stopped patient was started on Crestor 10 mg p.o. daily       No orders of the defined types were placed in this encounter.   Follow-up: No follow-ups on file.    Cletis Athens, MD

## 2021-06-11 NOTE — Addendum Note (Signed)
Addended by: Jobie Quaker on: 06/11/2021 03:31 PM   Modules accepted: Orders

## 2021-06-11 NOTE — Assessment & Plan Note (Signed)
Patient will be referred to nephrologist

## 2021-06-11 NOTE — Addendum Note (Signed)
Addended by: Jobie Quaker on: 06/11/2021 03:09 PM   Modules accepted: Orders

## 2021-06-12 LAB — ELECTROLYTE PANEL
CO2: 24 mmol/L (ref 20–32)
Chloride: 104 mmol/L (ref 98–110)
Potassium: 4.5 mmol/L (ref 3.5–5.3)
Sodium: 138 mmol/L (ref 135–146)

## 2021-07-11 ENCOUNTER — Ambulatory Visit: Payer: BC Managed Care – PPO | Admitting: Internal Medicine

## 2021-08-29 ENCOUNTER — Other Ambulatory Visit: Payer: Self-pay | Admitting: Internal Medicine

## 2021-08-29 DIAGNOSIS — I10 Essential (primary) hypertension: Secondary | ICD-10-CM

## 2022-06-10 ENCOUNTER — Encounter: Payer: Self-pay | Admitting: Internal Medicine

## 2022-06-10 ENCOUNTER — Ambulatory Visit (INDEPENDENT_AMBULATORY_CARE_PROVIDER_SITE_OTHER): Payer: BC Managed Care – PPO | Admitting: Internal Medicine

## 2022-06-10 VITALS — BP 115/80 | HR 98 | Ht 71.0 in | Wt 259.0 lb

## 2022-06-10 DIAGNOSIS — I10 Essential (primary) hypertension: Secondary | ICD-10-CM | POA: Diagnosis not present

## 2022-06-10 DIAGNOSIS — E785 Hyperlipidemia, unspecified: Secondary | ICD-10-CM | POA: Diagnosis not present

## 2022-06-10 DIAGNOSIS — N1832 Chronic kidney disease, stage 3b: Secondary | ICD-10-CM | POA: Diagnosis not present

## 2022-06-10 DIAGNOSIS — E669 Obesity, unspecified: Secondary | ICD-10-CM | POA: Diagnosis not present

## 2022-06-10 MED ORDER — METOPROLOL SUCCINATE ER 25 MG PO TB24: 25.0000 mg | ORAL_TABLET | Freq: Every day | ORAL | 1 refills | Status: DC

## 2022-06-10 MED ORDER — ROSUVASTATIN CALCIUM 10 MG PO TABS: 10.0000 mg | ORAL_TABLET | Freq: Every day | ORAL | 3 refills | Status: DC

## 2022-06-10 MED ORDER — METOPROLOL SUCCINATE ER 25 MG PO TB24: 25.0000 mg | ORAL_TABLET | Freq: Every day | ORAL | 3 refills | Status: DC

## 2022-06-10 NOTE — Progress Notes (Signed)
Established Patient Office Visit  Subjective:  Patient ID: Dillon Lewis, male    DOB: 10/09/1965  Age: 56 y.o. MRN: [id number]  CC:  Chief Complaint  Patient presents with   Hypertension    Hypertension  Alcohol Problem The patient's primary symptoms include intoxication and violence. Pertinent negatives include no agitation, confusion, delusions, hallucinations, loss of consciousness, seizures or self-injury. This is a chronic problem. The current episode started more than 1 year ago. The problem has been gradually worsening since onset. Suspected agents include alcohol. Past treatments include outpatient rehab.    Dillon Lewis presents for check up  Past Medical History:  Diagnosis Date   Hypertension     History reviewed. No pertinent surgical history.  History reviewed. No pertinent family history.  Social History   Socioeconomic History   Marital status: Single    Spouse name: Not on file   Number of children: Not on file   Years of education: Not on file   Highest education level: Not on file  Occupational History   Not on file  Tobacco Use   Smoking status: Every Day    Packs/day: 0.50    Types: Cigarettes   Smokeless tobacco: Never  Substance and Sexual Activity   Alcohol use: Yes    Comment: occasional   Drug use: Never   Sexual activity: Not on file  Other Topics Concern   Not on file  Social History Narrative   Not on file   Social Determinants of Health   Financial Resource Strain: Not on file  Food Insecurity: Not on file  Transportation Needs: Not on file  Physical Activity: Not on file  Stress: Not on file  Social Connections: Not on file  Intimate Partner Violence: Not on file     Current Outpatient Medications:    rosuvastatin (CRESTOR) 10 MG tablet, Take 1 tablet (10 mg total) by mouth daily., Disp: 90 tablet, Rfl: 3   metoprolol succinate (TOPROL-XL) 25 MG 24 hr tablet, Take 1 tablet (25 mg total) by mouth daily., Disp: 90  tablet, Rfl: 1   Allergies  Allergen Reactions   Lisinopril-Hydrochlorothiazide Other (See Comments)    hypotension    ROS Review of Systems  Constitutional: Negative.   HENT: Negative.    Eyes: Negative.   Respiratory: Negative.    Cardiovascular: Negative.   Gastrointestinal: Negative.   Endocrine: Negative.   Genitourinary: Negative.   Musculoskeletal: Negative.   Skin: Negative.   Allergic/Immunologic: Negative.   Neurological: Negative.  Negative for seizures and loss of consciousness.  Hematological: Negative.   Psychiatric/Behavioral: Negative.  Negative for agitation, confusion, hallucinations and self-injury.   All other systems reviewed and are negative.     Objective:    Physical Exam Vitals reviewed.  Constitutional:      Appearance: Normal appearance.  HENT:     Mouth/Throat:     Mouth: Mucous membranes are moist.  Eyes:     Pupils: Pupils are equal, round, and reactive to light.  Neck:     Vascular: No carotid bruit.  Cardiovascular:     Rate and Rhythm: Normal rate and regular rhythm.     Pulses: Normal pulses.     Heart sounds: Normal heart sounds.  Pulmonary:     Effort: Pulmonary effort is normal.     Breath sounds: Normal breath sounds.  Abdominal:     General: Bowel sounds are normal.     Palpations: Abdomen is soft. There is no hepatomegaly, splenomegaly  or mass.     Tenderness: There is no abdominal tenderness.     Hernia: No hernia is present.  Musculoskeletal:     Cervical back: Neck supple.     Right lower leg: No edema.     Left lower leg: No edema.  Skin:    Findings: No rash.  Neurological:     Mental Status: He is alert and oriented to person, place, and time.     Motor: No weakness.  Psychiatric:        Mood and Affect: Mood normal.        Behavior: Behavior normal.     BP 115/80   Pulse 98   Ht 5' 11"  (1.803 m)   Wt 259 lb (117.5 kg)   BMI 36.12 kg/m  Wt Readings from Last 3 Encounters:  06/10/22 259 lb (117.5  kg)  06/11/21 232 lb (105.2 kg)  05/29/21 228 lb 9.6 oz (103.7 kg)     Health Maintenance Due  Topic Date Due   COLONOSCOPY (Pts 45-54yr Insurance coverage will need to be confirmed)  Never done    There are no preventive care reminders to display for this patient.  Lab Results  Component Value Date   TSH 1.66 08/10/2020   Lab Results  Component Value Date   WBC 11.8 (H) 03/23/2021   HGB 16.4 03/23/2021   HCT 45.9 03/23/2021   MCV 102.5 (H) 03/23/2021   PLT 234 03/23/2021   Lab Results  Component Value Date   NA 138 06/11/2021   K 4.5 06/11/2021   CO2 24 06/11/2021   GLUCOSE 78 05/02/2021   BUN 16 05/02/2021   CREATININE 1.06 05/02/2021   BILITOT 0.8 03/23/2021   ALKPHOS 89 03/23/2021   AST 59 (H) 03/23/2021   ALT 40 03/23/2021   PROT 7.9 03/23/2021   ALBUMIN 3.9 03/23/2021   CALCIUM 8.9 05/02/2021   ANIONGAP 13 03/23/2021   EGFR 84 05/02/2021   Lab Results  Component Value Date   CHOL 244 (H) 08/10/2020   Lab Results  Component Value Date   HDL 66 08/10/2020   Lab Results  Component Value Date   LDLCALC 151 (H) 08/10/2020   Lab Results  Component Value Date   TRIG 142 08/10/2020   Lab Results  Component Value Date   CHOLHDL 3.7 08/10/2020   No results found for: "HGBA1C"    Assessment & Plan:   Problem List Items Addressed This Visit       Cardiovascular and Mediastinum   Essential hypertension   Relevant Medications   rosuvastatin (CRESTOR) 10 MG tablet   metoprolol succinate (TOPROL-XL) 25 MG 24 hr tablet     Other   Hyperlipidemia   Relevant Medications   rosuvastatin (CRESTOR) 10 MG tablet   metoprolol succinate (TOPROL-XL) 25 MG 24 hr tablet    Meds ordered this encounter  Medications   DISCONTD: metoprolol succinate (TOPROL-XL) 25 MG 24 hr tablet    Sig: Take 1 tablet (25 mg total) by mouth daily.    Dispense:  90 tablet    Refill:  3   rosuvastatin (CRESTOR) 10 MG tablet    Sig: Take 1 tablet (10 mg total) by mouth  daily.    Dispense:  90 tablet    Refill:  3   metoprolol succinate (TOPROL-XL) 25 MG 24 hr tablet    Sig: Take 1 tablet (25 mg total) by mouth daily.    Dispense:  90 tablet    Refill:  1    Follow-up: No follow-ups on file.    Cletis Athens, MD

## 2022-06-28 DIAGNOSIS — M79674 Pain in right toe(s): Secondary | ICD-10-CM | POA: Diagnosis not present

## 2022-06-28 DIAGNOSIS — I10 Essential (primary) hypertension: Secondary | ICD-10-CM | POA: Diagnosis not present

## 2022-06-28 DIAGNOSIS — R600 Localized edema: Secondary | ICD-10-CM | POA: Diagnosis not present

## 2022-06-28 DIAGNOSIS — M109 Gout, unspecified: Secondary | ICD-10-CM | POA: Diagnosis not present

## 2022-07-12 ENCOUNTER — Other Ambulatory Visit: Payer: Self-pay | Admitting: Nurse Practitioner

## 2022-07-12 MED ORDER — METOPROLOL SUCCINATE ER 25 MG PO TB24: 25.0000 mg | ORAL_TABLET | Freq: Every day | ORAL | 1 refills | Status: AC

## 2022-07-25 ENCOUNTER — Ambulatory Visit: Payer: 59 | Admitting: Nurse Practitioner

## 2022-08-08 ENCOUNTER — Encounter: Payer: Self-pay | Admitting: Nurse Practitioner

## 2022-08-08 ENCOUNTER — Ambulatory Visit (INDEPENDENT_AMBULATORY_CARE_PROVIDER_SITE_OTHER): Payer: 59 | Admitting: Nurse Practitioner

## 2022-08-08 VITALS — BP 113/73 | Ht 71.0 in | Wt 253.3 lb

## 2022-08-08 DIAGNOSIS — R600 Localized edema: Secondary | ICD-10-CM

## 2022-08-08 MED ORDER — SPIRONOLACTONE 25 MG PO TABS: 25.0000 mg | ORAL_TABLET | Freq: Every day | ORAL | 3 refills | Status: AC

## 2022-08-08 NOTE — Progress Notes (Signed)
Established Patient Office Visit  Subjective:  Patient ID: Dillon Lewis, male    DOB: 1966/07/03  Age: 56 y.o. MRN: [id number]  CC: No chief complaint on file.    HPI  Dillon Lewis presents for swelling in the leg bilaterally. Its going on from 2 months.  Marland Kitchen  HPI   Past Medical History:  Diagnosis Date   Hypertension     No past surgical history on file.  No family history on file.  Social History   Socioeconomic History   Marital status: Single    Spouse name: Not on file   Number of children: Not on file   Years of education: Not on file   Highest education level: Not on file  Occupational History   Not on file  Tobacco Use   Smoking status: Former    Packs/day: 0.50    Types: Cigarettes    Quit date: 06/10/2022    Years since quitting: 0.1   Smokeless tobacco: Never  Substance and Sexual Activity   Alcohol use: Not Currently    Comment: 08/08/2022   Drug use: Never   Sexual activity: Not on file  Other Topics Concern   Not on file  Social History Narrative   Not on file   Social Determinants of Health   Financial Resource Strain: Not on file  Food Insecurity: Not on file  Transportation Needs: Not on file  Physical Activity: Not on file  Stress: Not on file  Social Connections: Not on file  Intimate Partner Violence: Not on file     Outpatient Medications Prior to Visit  Medication Sig Dispense Refill   metoprolol succinate (TOPROL-XL) 25 MG 24 hr tablet Take 1 tablet (25 mg total) by mouth daily. 90 tablet 1   No facility-administered medications prior to visit.    Allergies  Allergen Reactions   Lisinopril-Hydrochlorothiazide Other (See Comments)    hypotension    ROS Review of Systems  Constitutional: Negative.   HENT: Negative.    Eyes: Negative.   Respiratory:  Negative for chest tightness.   Cardiovascular:  Positive for leg swelling.  Gastrointestinal: Negative.   Genitourinary: Negative.   Musculoskeletal: Negative.        Objective:    Physical Exam Constitutional:      Appearance: Normal appearance. He is obese.  HENT:     Right Ear: Tympanic membrane normal.     Left Ear: Tympanic membrane normal.  Cardiovascular:     Pulses: Normal pulses.     Heart sounds: Normal heart sounds.  Pulmonary:     Effort: Pulmonary effort is normal.     Breath sounds: Normal breath sounds.  Abdominal:     General: Bowel sounds are normal.     Palpations: Abdomen is soft. There is no mass.     Tenderness: There is no abdominal tenderness.  Musculoskeletal:     Right lower leg: Edema present.     Left lower leg: Edema present.  Neurological:     General: No focal deficit present.     Mental Status: He is alert and oriented to person, place, and time. Mental status is at baseline.  Psychiatric:        Mood and Affect: Mood normal.        Behavior: Behavior normal.        Thought Content: Thought content normal.        Judgment: Judgment normal.     BP 113/73   Ht _0  (  1.803 m)   Wt 253 lb 5 oz (114.9 kg)   BMI 35.33 kg/m  Wt Readings from Last 3 Encounters:  08/08/22 253 lb 5 oz (114.9 kg)  06/10/22 259 lb (117.5 kg)  06/11/21 232 lb (105.2 kg)     Health Maintenance  Topic Date Due   DTaP/Tdap/Td (1 - Tdap) Never done   COLONOSCOPY (Pts 45-28yr Insurance coverage will need to be confirmed)  Never done   Zoster Vaccines- Shingrix (1 of 2) 09/10/2022 (Originally 09/17/2017)   Hepatitis C Screening  06/11/2023 (Originally 09/17/1985)   HIV Screening  06/11/2023 (Originally 09/17/1982)   INFLUENZA VACCINE  11/08/2023 (Originally 04/09/2022)   COVID-19 Vaccine (1) 06/26/2024 (Originally 03/17/1966)   HPV VACCINES  Aged Out    There are no preventive care reminders to display for this patient.  Lab Results  Component Value Date   TSH 1.66 08/10/2020   Lab Results  Component Value Date   WBC 11.8 (H) 03/23/2021   HGB 16.4 03/23/2021   HCT 45.9 03/23/2021   MCV 102.5 (H) 03/23/2021   PLT 234  03/23/2021   Lab Results  Component Value Date   NA 138 06/11/2021   K 4.5 06/11/2021   CO2 24 06/11/2021   GLUCOSE 78 05/02/2021   BUN 16 05/02/2021   CREATININE 1.06 05/02/2021   BILITOT 0.8 03/23/2021   ALKPHOS 89 03/23/2021   AST 59 (H) 03/23/2021   ALT 40 03/23/2021   PROT 7.9 03/23/2021   ALBUMIN 3.9 03/23/2021   CALCIUM 8.9 05/02/2021   ANIONGAP 13 03/23/2021   EGFR 84 05/02/2021   Lab Results  Component Value Date   CHOL 244 (H) 08/10/2020   Lab Results  Component Value Date   HDL 66 08/10/2020   Lab Results  Component Value Date   LDLCALC 151 (H) 08/10/2020   Lab Results  Component Value Date   TRIG 142 08/10/2020   Lab Results  Component Value Date   CHOLHDL 3.7 08/10/2020   No results found for: "HGBA1C"    Assessment & Plan:   Problem List Items Addressed This Visit       Other   Bilateral leg edema - Primary    Started him on spironolactone 25 mg daily. Encourage patient to keep the feet elevated and use compression stockings. Will continue to monitor.        Meds ordered this encounter  Medications   spironolactone (ALDACTONE) 25 MG tablet    Sig: Take 1 tablet (25 mg total) by mouth daily.    Dispense:  90 tablet    Refill:  3     Follow-up: No follow-ups on file.    CTheresia Lo NP

## 2022-08-21 DIAGNOSIS — R600 Localized edema: Secondary | ICD-10-CM | POA: Insufficient documentation

## 2022-08-21 NOTE — Assessment & Plan Note (Signed)
Started him on spironolactone 25 mg daily. Encourage patient to keep the feet elevated and use compression stockings. Will continue to monitor.

## 2022-09-12 ENCOUNTER — Encounter: Payer: Self-pay | Admitting: Nurse Practitioner

## 2022-09-12 ENCOUNTER — Ambulatory Visit (INDEPENDENT_AMBULATORY_CARE_PROVIDER_SITE_OTHER): Payer: 59 | Admitting: Nurse Practitioner

## 2022-09-12 VITALS — BP 110/72 | HR 68 | Ht 71.0 in | Wt 250.8 lb

## 2022-09-12 DIAGNOSIS — R600 Localized edema: Secondary | ICD-10-CM

## 2022-09-12 NOTE — Progress Notes (Signed)
Established Patient Office Visit  Subjective:  Patient ID: Dillon Lewis, male    DOB: Nov 01, 1965  Age: 57 y.o. MRN: [id number]  CC:  Chief Complaint  Patient presents with   Leg Swelling    1 month FU, a little better     HPI  Dillon Lewis presents for follow up on swelling in the leg bilaterally. He is doing better on medication. No other concerns at preset.   HPI   Past Medical History:  Diagnosis Date   Hypertension     No past surgical history on file.  Family History  Problem Relation Age of Onset   Diabetes Father    Heart failure Father     Social History   Socioeconomic History   Marital status: Single    Spouse name: Not on file   Number of children: Not on file   Years of education: Not on file   Highest education level: Not on file  Occupational History   Not on file  Tobacco Use   Smoking status: Former    Packs/day: 0.50    Types: Cigarettes    Quit date: 06/10/2022    Years since quitting: 0.2   Smokeless tobacco: Never  Substance and Sexual Activity   Alcohol use: Not Currently    Comment: 08/08/2022   Drug use: Never   Sexual activity: Not on file  Other Topics Concern   Not on file  Social History Narrative   Not on file   Social Determinants of Health   Financial Resource Strain: Not on file  Food Insecurity: Not on file  Transportation Needs: Not on file  Physical Activity: Not on file  Stress: Not on file  Social Connections: Not on file  Intimate Partner Violence: Not on file     Outpatient Medications Prior to Visit  Medication Sig Dispense Refill   metoprolol succinate (TOPROL-XL) 25 MG 24 hr tablet Take 1 tablet (25 mg total) by mouth daily. 90 tablet 1   spironolactone (ALDACTONE) 25 MG tablet Take 1 tablet (25 mg total) by mouth daily. 90 tablet 3   No facility-administered medications prior to visit.    Allergies  Allergen Reactions   Lisinopril-Hydrochlorothiazide Other (See Comments)    hypotension     ROS Review of Systems  Constitutional: Negative.   HENT: Negative.    Eyes: Negative.   Respiratory:  Negative for chest tightness and shortness of breath.   Cardiovascular:  Positive for leg swelling.  Gastrointestinal: Negative.   Genitourinary: Negative.   Musculoskeletal:  Positive for arthralgias.  Skin: Negative.   Neurological: Negative.   Psychiatric/Behavioral: Negative.        Objective:    Physical Exam Constitutional:      Appearance: Normal appearance. He is obese.  HENT:     Right Ear: Tympanic membrane normal.     Left Ear: Tympanic membrane normal.     Nose: Nose normal.     Mouth/Throat:     Mouth: Mucous membranes are moist.  Eyes:     Pupils: Pupils are equal, round, and reactive to light.  Cardiovascular:     Rate and Rhythm: Normal rate and regular rhythm.     Pulses: Normal pulses.     Heart sounds: Normal heart sounds.  Pulmonary:     Effort: Pulmonary effort is normal.     Breath sounds: Normal breath sounds.  Abdominal:     General: Bowel sounds are normal. There is distension.  Palpations: Abdomen is soft. There is no mass.  Musculoskeletal:        General: No swelling or signs of injury.     Cervical back: Normal range of motion.     Right lower leg: Edema present.     Left lower leg: Edema present.  Skin:    Coloration: Skin is not jaundiced.     Findings: No erythema.  Neurological:     General: No focal deficit present.     Mental Status: He is alert and oriented to person, place, and time. Mental status is at baseline.  Psychiatric:        Mood and Affect: Mood normal.        Behavior: Behavior normal.        Thought Content: Thought content normal.        Judgment: Judgment normal.     BP 110/72   Pulse 68   Ht _0  (1.803 m)   Wt 250 lb 12.8 oz (113.8 kg)   SpO2 97%   BMI 34.98 kg/m  Wt Readings from Last 3 Encounters:  09/12/22 250 lb 12.8 oz (113.8 kg)  08/08/22 253 lb 5 oz (114.9 kg)  06/10/22 259 lb  (117.5 kg)     Health Maintenance  Topic Date Due   DTaP/Tdap/Td (1 - Tdap) Never done   COLONOSCOPY (Pts 45-84yr Insurance coverage will need to be confirmed)  Never done   Zoster Vaccines- Shingrix (1 of 2) Never done   Hepatitis C Screening  06/11/2023 (Originally 09/17/1985)   HIV Screening  06/11/2023 (Originally 09/17/1982)   INFLUENZA VACCINE  11/08/2023 (Originally 04/09/2022)   COVID-19 Vaccine (1) 06/26/2024 (Originally 03/17/1966)   HPV VACCINES  Aged Out    There are no preventive care reminders to display for this patient.  Lab Results  Component Value Date   TSH 1.66 08/10/2020   Lab Results  Component Value Date   WBC 11.8 (H) 03/23/2021   HGB 16.4 03/23/2021   HCT 45.9 03/23/2021   MCV 102.5 (H) 03/23/2021   PLT 234 03/23/2021   Lab Results  Component Value Date   NA 138 06/11/2021   K 4.5 06/11/2021   CO2 24 06/11/2021   GLUCOSE 78 05/02/2021   BUN 16 05/02/2021   CREATININE 1.06 05/02/2021   BILITOT 0.8 03/23/2021   ALKPHOS 89 03/23/2021   AST 59 (H) 03/23/2021   ALT 40 03/23/2021   PROT 7.9 03/23/2021   ALBUMIN 3.9 03/23/2021   CALCIUM 8.9 05/02/2021   ANIONGAP 13 03/23/2021   EGFR 84 05/02/2021   Lab Results  Component Value Date   CHOL 244 (H) 08/10/2020   Lab Results  Component Value Date   HDL 66 08/10/2020   Lab Results  Component Value Date   LDLCALC 151 (H) 08/10/2020   Lab Results  Component Value Date   TRIG 142 08/10/2020   Lab Results  Component Value Date   CHOLHDL 3.7 08/10/2020   No results found for: "HGBA1C"    Assessment & Plan:   Problem List Items Addressed This Visit       Other   Bilateral leg edema - Primary    Edema improved. Continue spironolactone. Keep the feet elevated and use compression stockings.        No orders of the defined types were placed in this encounter.    Follow-up: No follow-ups on file.    CTheresia Lo NP

## 2022-09-14 ENCOUNTER — Encounter: Payer: Self-pay | Admitting: Nurse Practitioner

## 2022-09-14 NOTE — Assessment & Plan Note (Signed)
Edema improved. Continue spironolactone. Keep the feet elevated and use compression stockings.

## 2023-08-17 ENCOUNTER — Other Ambulatory Visit: Payer: Self-pay | Admitting: Nurse Practitioner
